# Patient Record
Sex: Male | Born: 1975
Health system: Southern US, Community
[De-identification: ages and names within clinical notes are randomized; demographics above are authoritative.]

## PROBLEM LIST (undated history)

## (undated) DIAGNOSIS — M21612 Bunion of left foot: Secondary | ICD-10-CM

## (undated) DIAGNOSIS — C801 Malignant (primary) neoplasm, unspecified: Secondary | ICD-10-CM

## (undated) DIAGNOSIS — M21611 Bunion of right foot: Secondary | ICD-10-CM

## (undated) DIAGNOSIS — S86899A Other injury of other muscle(s) and tendon(s) at lower leg level, unspecified leg, initial encounter: Secondary | ICD-10-CM

## (undated) HISTORY — PX: COLONOSCOPY: SHX174

## (undated) HISTORY — DX: Bunion of left foot: M21.612

## (undated) HISTORY — DX: Other injury of other muscle(s) and tendon(s) at lower leg level, unspecified leg, initial encounter: S86.899A

## (undated) HISTORY — PX: WISDOM TOOTH EXTRACTION: SHX21

## (undated) HISTORY — DX: Bunion of left foot: M21.611

## (undated) HISTORY — DX: Malignant (primary) neoplasm, unspecified: C80.1

---

## 2002-06-10 ENCOUNTER — Emergency Department (HOSPITAL_COMMUNITY): Admission: EM | Admit: 2002-06-10 | Discharge: 2002-06-10 | Payer: Self-pay | Admitting: Emergency Medicine

## 2011-03-11 ENCOUNTER — Encounter: Payer: Self-pay | Admitting: Gastroenterology

## 2011-03-11 DIAGNOSIS — J302 Other seasonal allergic rhinitis: Secondary | ICD-10-CM | POA: Insufficient documentation

## 2011-04-14 ENCOUNTER — Encounter: Payer: Self-pay | Admitting: Gastroenterology

## 2011-04-14 ENCOUNTER — Ambulatory Visit (INDEPENDENT_AMBULATORY_CARE_PROVIDER_SITE_OTHER): Payer: BC Managed Care – PPO | Admitting: Gastroenterology

## 2011-04-14 VITALS — BP 120/70 | HR 60 | Ht 73.0 in | Wt 224.2 lb

## 2011-04-14 DIAGNOSIS — Z800 Family history of malignant neoplasm of digestive organs: Secondary | ICD-10-CM | POA: Insufficient documentation

## 2011-04-14 MED ORDER — PEG-KCL-NACL-NASULF-NA ASC-C 100 G PO SOLR: 1.0000 | ORAL | Status: DC

## 2011-04-14 NOTE — Progress Notes (Signed)
   HPI: This is a  Very pleasant 35 yo man whose father was diagnosed with colon cancer in his early 38s. He was apparently cured with surgery alone.  He has no issues with his bowels.  No bleeding, no constipation.  Very regular   Review of systems: Pertinent positive and negative review of systems were noted in the above HPI section.  All other review of systems was otherwise negative.   Past Medical History  Diagnosis Date  . Shin splints     Past Surgical History  Procedure Date  . Mouth surgery     wisdom teeth     reports that he has never smoked. He has never used smokeless tobacco. He reports that he drinks alcohol. He reports that he does not use illicit drugs.  family history includes Cancer in his maternal grandfather; Colon cancer in his father; Hypertension in his paternal grandmother; Rheum arthritis in his mother; Skin cancer in his mother; Stroke in his paternal grandmother; and Uterine cancer in his maternal grandmother.    Current Medications, Allergies were all reviewed with the patient via [facility] electronic medical record system.    Physical Exam: BP 120/70  Pulse 60  Ht 6\' 1"  (1.854 m)  Wt 224 lb 3.2 oz (101.696 kg)  BMI 29.58 kg/m2 Constitutional: generally well-appearing Psychiatric: alert and oriented x3 Eyes: extraocular movements intact Mouth: oral pharynx moist, no lesions Neck: supple no lymphadenopathy Cardiovascular: heart regular rate and rhythm Lungs: clear to auscultation bilaterally Abdomen: soft, nontender, nondistended, no obvious ascites, no peritoneal signs, normal bowel sounds Extremities: no lower extremity edema bilaterally Skin: no lesions on visible extremities    Assessment and plan: 35 y.o. male with fam history fo colon cancer  Will schedule for colonoscopy at his soonest convenience.

## 2011-04-14 NOTE — Patient Instructions (Signed)
You will be set up for a colonoscopy.  

## 2011-05-07 ENCOUNTER — Encounter: Payer: Self-pay | Admitting: Gastroenterology

## 2011-05-07 ENCOUNTER — Ambulatory Visit (AMBULATORY_SURGERY_CENTER): Payer: BC Managed Care – PPO | Admitting: Gastroenterology

## 2011-05-07 VITALS — BP 114/69 | HR 65 | Temp 97.7°F | Resp 20 | Ht 73.0 in | Wt 224.0 lb

## 2011-05-07 DIAGNOSIS — Z1211 Encounter for screening for malignant neoplasm of colon: Secondary | ICD-10-CM

## 2011-05-07 DIAGNOSIS — Z800 Family history of malignant neoplasm of digestive organs: Secondary | ICD-10-CM

## 2011-05-07 MED ORDER — SODIUM CHLORIDE 0.9 % IV SOLN: 500.0000 mL | INTRAVENOUS | Status: DC

## 2011-05-07 NOTE — Patient Instructions (Signed)
Please refer to the blue and neon green sheets for instructions regarding diet and activity for the rest of today.  Normal. Resume previous medications.

## 2011-05-10 ENCOUNTER — Telehealth: Payer: Self-pay | Admitting: *Deleted

## 2011-05-10 NOTE — Telephone Encounter (Signed)
No ID on answering machine, no message left 

## 2013-11-07 ENCOUNTER — Encounter: Payer: Self-pay | Admitting: Family Medicine

## 2013-11-07 ENCOUNTER — Ambulatory Visit (INDEPENDENT_AMBULATORY_CARE_PROVIDER_SITE_OTHER): Payer: BC Managed Care – PPO | Admitting: Family Medicine

## 2013-11-07 VITALS — BP 130/84 | HR 61 | Ht 73.0 in | Wt 205.0 lb

## 2013-11-07 DIAGNOSIS — M999 Biomechanical lesion, unspecified: Secondary | ICD-10-CM | POA: Insufficient documentation

## 2013-11-07 DIAGNOSIS — M533 Sacrococcygeal disorders, not elsewhere classified: Secondary | ICD-10-CM | POA: Insufficient documentation

## 2013-11-07 NOTE — Assessment & Plan Note (Signed)
Decision today to treat with OMT was based on Physical Exam  After verbal consent patient was treated with HVLA ME techniques in thoracic, lumbar, and sacral areas  Patient tolerated the procedure well with improvement in symptoms  Patient given exercises, stretches and lifestyle modifications  See medications in patient instructions if given  Patient will follow up in 3 weeks        

## 2013-11-07 NOTE — Progress Notes (Signed)
[name] [address] [place], [place] [address] Phone: [phone] Subjective:     CC: Back pain   [id number] Nathaniel [name] is a 38 y.o. male coming in with complaint of back pain. Patient has been much more active recently and has lost 25 pounds with the course last year. Patient has been doing a boot camp a regular basis and states during exercise he does not have any pain but unfortunately during the day he starts having increasing tightness. Patient points closer to the right side of his back and states it's in the lumbar and sacral region. Patient denies any radiation down the leg or any numbness or weakness of the extremity. Patient states that it is very uncomfortable. Patient states that the last couple days he has stopped his workout routine disease care that is making it worse. Once again no significant pain no with exercise. Patient rated the pain as 6/10 in severity. Patient is concerned because if it starts affecting his activities daily living. Patient wants to remain active but he does not injure himself. Describes the pain as more of an ache that can have sharp pain. Does not respond well to over-the-counter medicines but does respond decent to some rest. No nighttime awakening. Denies any fevers or chills.      Past medical history, social, surgical and family history all reviewed in electronic medical record.   Review of Systems: No headache, visual changes, nausea, vomiting, diarrhea, constipation, dizziness, abdominal pain, skin rash, fevers, chills, night sweats, weight loss, swollen lymph nodes, body aches, joint swelling, muscle aches, chest pain, shortness of breath, mood changes.   Objective Blood pressure 130/84, pulse 61, height 6\' 1"  (1.854 m), weight 205 lb (92.987 kg), SpO2 97.00%.  General: No apparent distress alert and oriented x3 mood and affect normal, dressed appropriately.  HEENT: Pupils equal, extraocular movements intact    Respiratory: Patient's speak in full sentences and does not appear short of breath  Cardiovascular: No lower extremity edema, non tender, no erythema  Skin: Warm dry intact with no signs of infection or rash on extremities or on axial skeleton.  Abdomen: Soft nontender  Neuro: Cranial nerves II through XII are intact, neurovascularly intact in all extremities with 2+ DTRs and 2+ pulses.  Lymph: No lymphadenopathy of posterior or anterior cervical chain or axillae bilaterally.  Gait normal with good balance and coordination.  MSK:  Non tender with full range of motion and good stability and symmetric strength and tone of shoulders, elbows, wrist, hip, knee and ankles bilaterally.  Back Exam:  Inspection: Unremarkable  Motion: Flexion 45 deg, Extension 35 deg, Side Bending to 35 deg bilaterally,  Rotation to 35 deg bilaterally  SLR laying: Negative  XSLR laying: Negative  Palpable tenderness: Tender to palpation over the right SI joint. FABER: Positive for right Sensory change: Gross sensation intact to all lumbar and sacral dermatomes.  Reflexes: 2+ at both patellar tendons, 2+ at achilles tendons, Babinski's downgoing.  Strength at foot  Plantar-flexion: 5/5 Dorsi-flexion: 5/5 Eversion: 5/5 Inversion: 5/5  Leg strength  Quad: 5/5 Hamstring: 5/5 Hip flexor: 5/5 Hip abductors: 5/5  Gait unremarkable.  OMT Physical Exam  Standing flexion right  Seated Flexion right  Cervical Neutral   Thoracic T3 extended rotated and side bent left T7 extended rotated and side bent right  Lumbar L2 flexed rotated and side bent right  Sacrum Right on right  Illium  Neutral     Impression and Recommendations:  This case required medical decision making of moderate complexity.

## 2013-11-07 NOTE — Assessment & Plan Note (Signed)
Patient does have some sacroiliac joint dysfunction. Discuss different treatment options and patient wanted to stay with conservative therapy. I do agree with this. Patient given suggestions of over-the-counter medications and was given home exercise program. We discussed about proper stretching and proper technique with certain things. We discussed icing protocol. I also discussed patient with proper nutritional changes he can be beneficial. Patient did respond osteopathic manipulatoin.

## 2013-11-07 NOTE — Patient Instructions (Signed)
Very nice to meet you Exercises 3 times a week.  Do not stretch before activity but definitely aftward.  Sacroiliac Joint Mobilization and Rehab 1. Work on pretzel stretching, shoulder back and leg draped in front. 3-5 sets, 30 sec.. 2. hip abductor rotations. standing, hip flexion and rotation outward then inward. 3 sets, 15 reps. when can do comfortably, add ankle weights starting at 2 pounds.  3. cross over stretching - shoulder back to ground, same side leg crossover. 3-5 sets for 30 seconds..  4. rolling up and back knees to chest and rocking. 5. sacral tilt - 5 sets, hold for 5-10 seconds Hip flexor stretches then after biking or running.  When working try not to sit for great then 45 minutes.  Turmeric 500mg  twice daily Vitamin D 200 IU daily Whey protein isolate and 4:1 ratio of carbs to protein after exercise.  Example chocolate milk.  Come back in 2-3 weeks.

## 2013-11-28 ENCOUNTER — Ambulatory Visit (INDEPENDENT_AMBULATORY_CARE_PROVIDER_SITE_OTHER): Payer: BC Managed Care – PPO | Admitting: Family Medicine

## 2013-11-28 ENCOUNTER — Encounter: Payer: Self-pay | Admitting: Family Medicine

## 2013-11-28 VITALS — BP 130/76 | HR 64 | Ht 73.0 in | Wt 205.0 lb

## 2013-11-28 DIAGNOSIS — M533 Sacrococcygeal disorders, not elsewhere classified: Secondary | ICD-10-CM

## 2013-11-28 DIAGNOSIS — M999 Biomechanical lesion, unspecified: Secondary | ICD-10-CM

## 2013-11-28 NOTE — Assessment & Plan Note (Signed)
Decision today to treat with OMT was based on Physical Exam  After verbal consent patient was treated with HVLA ME techniques in thoracic, lumbar, and sacral areas  Patient tolerated the procedure well with improvement in symptoms  Patient given exercises, stretches and lifestyle modifications  See medications in patient instructions if given  Patient will follow up in 4-5 weeks

## 2013-11-28 NOTE — Assessment & Plan Note (Signed)
Patient is doing significantly better at this time. Patient was taught other exercises himself relation techniques that could be beneficial. We discussed over-the-counter medications again that could be beneficial. Patient will come back again in 4-6 weeks.  Spent greater than 25 minutes with patient face-to-face and had greater than 50% of counseling including as described above in assessment and plan.

## 2013-11-28 NOTE — Progress Notes (Signed)
  [name] [address] [place], [place] [address] Phone: [phone] Subjective:     CC: Back pain   [id number] Nathaniel [name] is a 38 y.o. male coming in for followup of sacroiliac joint dysfunction. Patient is doing considerably better he states. Patient is has a spasm from time to time that causes him some pain but his lungs he does the stretches it seems to go away almost completely. Patient denies any radiation down the legs or any numbness. Patient states that he is beginning to do all activities of daily living and is able to do his workout program and is not stopping prematurely. Patient does the manipulation has been helpful.     Past medical history, social, surgical and family history all reviewed in electronic medical record.   Review of Systems: No headache, visual changes, nausea, vomiting, diarrhea, constipation, dizziness, abdominal pain, skin rash, fevers, chills, night sweats, weight loss, swollen lymph nodes, body aches, joint swelling, muscle aches, chest pain, shortness of breath, mood changes.   Objective Blood pressure 130/76, pulse 64, height 6\' 1"  (1.854 m), SpO2 97.00%.  General: No apparent distress alert and oriented x3 mood and affect normal, dressed appropriately.  HEENT: Pupils equal, extraocular movements intact  Respiratory: Patient's speak in full sentences and does not appear short of breath  Cardiovascular: No lower extremity edema, non tender, no erythema  Skin: Warm dry intact with no signs of infection or rash on extremities or on axial skeleton.  Abdomen: Soft nontender  Neuro: Cranial nerves II through XII are intact, neurovascularly intact in all extremities with 2+ DTRs and 2+ pulses.  Lymph: No lymphadenopathy of posterior or anterior cervical chain or axillae bilaterally.  Gait normal with good balance and coordination.  MSK:  Non tender with full range of motion and good stability and symmetric strength and  tone of shoulders, elbows, wrist, hip, knee and ankles bilaterally.  Back Exam:  Inspection: Unremarkable  Motion: Flexion 45 deg, Extension 35 deg, Side Bending to 35 deg bilaterally,  Rotation to 35 deg bilaterally  SLR laying: Negative  XSLR laying: Negative  Palpable tenderness: Still mildly Tender to palpation over the right SI joint. FABER: Positive for right Sensory change: Gross sensation intact to all lumbar and sacral dermatomes.  Reflexes: 2+ at both patellar tendons, 2+ at achilles tendons, Babinski's downgoing.  Strength at foot  Plantar-flexion: 5/5 Dorsi-flexion: 5/5 Eversion: 5/5 Inversion: 5/5  Leg strength  Quad: 5/5 Hamstring: 5/5 Hip flexor: 5/5 Hip abductors: 5/5  Gait unremarkable.  OMT Physical Exam  Cervical C3 flexed rotated and side bent left  Thoracic T3 extended rotated and side bent left  Lumbar L2 flexed rotated and side bent right  Sacrum Right on right  Illium  Neutral     Impression and Recommendations:     This case required medical decision making of moderate complexity.

## 2013-11-28 NOTE — Patient Instructions (Signed)
Good to see you You are doing great Turmeric 500mg  twice daily.  Sugar packs looks for glucose, fructose and possibly sucrose.  Look at new exercises and look at 3 days a week.  Come back in 4-5 weeks.

## 2014-01-03 ENCOUNTER — Ambulatory Visit (INDEPENDENT_AMBULATORY_CARE_PROVIDER_SITE_OTHER): Payer: BC Managed Care – PPO | Admitting: Family Medicine

## 2014-01-03 ENCOUNTER — Encounter: Payer: Self-pay | Admitting: Family Medicine

## 2014-01-03 VITALS — BP 118/72 | HR 53 | Ht 73.0 in | Wt 201.0 lb

## 2014-01-03 DIAGNOSIS — M533 Sacrococcygeal disorders, not elsewhere classified: Secondary | ICD-10-CM

## 2014-01-03 DIAGNOSIS — M999 Biomechanical lesion, unspecified: Secondary | ICD-10-CM

## 2014-01-03 NOTE — Assessment & Plan Note (Signed)
Decision today to treat with OMT was based on Physical Exam  After verbal consent patient was treated with HVLA ME techniques in thoracic, lumbar, and sacral areas  Patient tolerated the procedure well with improvement in symptoms  Patient given exercises, stretches and lifestyle modifications  See medications in patient instructions if given  Patient will follow up in 5 weeks

## 2014-01-03 NOTE — Progress Notes (Signed)
  [name] [address] [place], [place] [address] Phone: [phone] Subjective:     CC: Back pain follow up  [id number] Nathaniel [name] is a 38 y.o. male coming in for followup of sacroiliac joint dysfunction. Patient states he has not had any spasm since last time. Overall he is feeling very good. Patient has remained active and is working out 4-5 times a week. Patient does the stretches intermittently. Patient is to do the more at work in between breaks. Denies any new symptoms and overall is feeling relatively well.    Past medical history, social, surgical and family history all reviewed in electronic medical record.   Review of Systems: No headache, visual changes, nausea, vomiting, diarrhea, constipation, dizziness, abdominal pain, skin rash, fevers, chills, night sweats, weight loss, swollen lymph nodes, body aches, joint swelling, muscle aches, chest pain, shortness of breath, mood changes.   Objective Blood pressure 118/72, pulse 53, height 6\' 1"  (1.854 m), weight 201 lb (91.173 kg), SpO2 96.00%.  General: No apparent distress alert and oriented x3 mood and affect normal, dressed appropriately.  HEENT: Pupils equal, extraocular movements intact  Respiratory: Patient's speak in full sentences and does not appear short of breath  Cardiovascular: No lower extremity edema, non tender, no erythema  Skin: Warm dry intact with no signs of infection or rash on extremities or on axial skeleton.  Abdomen: Soft nontender  Neuro: Cranial nerves II through XII are intact, neurovascularly intact in all extremities with 2+ DTRs and 2+ pulses.  Lymph: No lymphadenopathy of posterior or anterior cervical chain or axillae bilaterally.  Gait normal with good balance and coordination.  MSK:  Non tender with full range of motion and good stability and symmetric strength and tone of shoulders, elbows, wrist, hip, knee and ankles bilaterally.  Back Exam:  Inspection:  Unremarkable  Motion: Flexion 45 deg, Extension 35 deg, Side Bending to 40 deg bilaterally,  Rotation to 35 deg bilaterally  SLR laying: Negative  XSLR laying: Negative  Palpable tenderness: Still mildly Tender to palpation over the right SI joint continues to improve FABER: Negative Sensory change: Gross sensation intact to all lumbar and sacral dermatomes.  Reflexes: 2+ at both patellar tendons, 2+ at achilles tendons, Babinski's downgoing.  Strength at foot  Plantar-flexion: 5/5 Dorsi-flexion: 5/5 Eversion: 5/5 Inversion: 5/5  Leg strength  Quad: 5/5 Hamstring: 5/5 Hip flexor: 5/5 Hip abductors: 5/5  Gait unremarkable.  OMT Physical Exam  Cervical C3 flexed rotated and side bent left C5 flexed rotated inside that right  Thoracic T3 extended rotated and side bent left T5 extended rotated inside that right  Lumbar L2 flexed rotated and side bent right  Sacrum Right on right  Illium  Neutral     Impression and Recommendations:     This case required medical decision making of moderate complexity.

## 2014-01-03 NOTE — Patient Instructions (Addendum)
Good to see you as always.  Continue exercise 3 weeks.  You are doing great.  Great tattoo.  Come back in 5-6 weeks.  Turmeric 500mg  twice daily.

## 2014-01-03 NOTE — Assessment & Plan Note (Signed)
Patient is doing remarkably well at this time. Patient was given new exercises and face do strengthening exercises for the hip abductors and core that I think will be beneficial. Discussed icing as well as the over-the-counter medications which is not started yet. Patient will continue with the regimen that he is doing at this time. Patient will follow up though and 5-6 weeks for further evaluation and treatment.  Spent greater than 25 minutes with patient face-to-face and had greater than 50% of counseling including as described above in assessment and plan.

## 2014-01-07 ENCOUNTER — Encounter: Payer: Self-pay | Admitting: Family Medicine

## 2014-01-07 ENCOUNTER — Other Ambulatory Visit (INDEPENDENT_AMBULATORY_CARE_PROVIDER_SITE_OTHER): Payer: BC Managed Care – PPO

## 2014-01-07 ENCOUNTER — Ambulatory Visit (INDEPENDENT_AMBULATORY_CARE_PROVIDER_SITE_OTHER): Payer: BC Managed Care – PPO | Admitting: Family Medicine

## 2014-01-07 VITALS — BP 126/88 | HR 49 | Ht 73.0 in | Wt 202.0 lb

## 2014-01-07 DIAGNOSIS — M25511 Pain in right shoulder: Secondary | ICD-10-CM

## 2014-01-07 DIAGNOSIS — M25512 Pain in left shoulder: Secondary | ICD-10-CM

## 2014-01-07 DIAGNOSIS — M719 Bursopathy, unspecified: Secondary | ICD-10-CM

## 2014-01-07 DIAGNOSIS — M67919 Unspecified disorder of synovium and tendon, unspecified shoulder: Secondary | ICD-10-CM

## 2014-01-07 DIAGNOSIS — M75102 Unspecified rotator cuff tear or rupture of left shoulder, not specified as traumatic: Secondary | ICD-10-CM | POA: Insufficient documentation

## 2014-01-07 DIAGNOSIS — M25519 Pain in unspecified shoulder: Secondary | ICD-10-CM

## 2014-01-07 NOTE — Progress Notes (Signed)
[name] [address], [place] [address] Phone: [phone] Subjective:     CC: Left shoulder pain  [id number] Nathaniel [name] is a 38 y.o. male coming in with complaint of left shoulder pain. [place] wake [place]. Patient went and attempted to try to catch himself when he fell and felt some discomfort in the posterior aspect of the shoulder. I was 2 days ago and states that the pain seems to be worsening. Patient states that whenever he tries to do overhead activity as severe pain. Denies any numbness or tingling or any radiation down the arm. Denies any neck pain that is associated with a. Describes the pain as a dull aching sensation with a sharp 10 out of 10 pain with certain movements. Patient has never injured this shoulder previously.     Past medical history, social, surgical and family history all reviewed in electronic medical record.   Review of Systems: No headache, visual changes, nausea, vomiting, diarrhea, constipation, dizziness, abdominal pain, skin rash, fevers, chills, night sweats, weight loss, swollen lymph nodes, body aches, joint swelling, muscle aches, chest pain, shortness of breath, mood changes.   Objective Blood pressure 126/88, pulse 49, height 6\' 1"  (1.854 m), weight 202 lb (91.627 kg), SpO2 97.00%.  General: No apparent distress alert and oriented x3 mood and affect normal, dressed appropriately.  HEENT: Pupils equal, extraocular movements intact  Respiratory: Patient's speak in full sentences and does not appear short of breath  Cardiovascular: No lower extremity edema, non tender, no erythema  Skin: Warm dry intact with no signs of infection or rash on extremities or on axial skeleton.  Abdomen: Soft nontender  Neuro: Cranial nerves II through XII are intact, neurovascularly intact in all extremities with 2+ DTRs and 2+ pulses.  Lymph: No lymphadenopathy of  posterior or anterior cervical chain or axillae bilaterally.  Gait normal with good balance and coordination.  MSK:  Non tender with full range of motion and good stability and symmetric strength and tone of shoulders, elbows, wrist, hip, knee and ankles bilaterally.  Shoulder: left Inspection reveals no abnormalities, atrophy or asymmetry. Palpation is normal with no tenderness over AC joint or bicipital groove. ROM is full in all planes passively. Can tell patient does have pain with full flexion Rotator cuff strength normal throughout. signs of impingement with positive Neer and Hawkin's tests, but negative empty can sign. Speeds and Yergason's tests normal.  labral pathology noted positive Obrien's, negative clunk and good stability. Normal scapular function observed. No painful arc and no drop arm sign. No apprehension sign  MSK US performed of: left This study was ordered, performed, and interpreted by Charlann Boxer D.O.  Shoulder:   Supraspinatus:  Appears normal on long and transverse views, Bursal bulge seen with shoulder abduction on impingement view. Infraspinatus:  Appears normal on long and transverse views. Significant increase in Doppler flow Subscapularis:  Appears normal on long and transverse views. Positive bursa as well as increasing output flow Teres Minor:  Appears normal on long and transverse views. AC joint:  Capsule undistended, no geyser sign. Glenohumeral Joint:  Appears normal without effusion. Glenoid Labrum:  Intact without visualized tears. Patient though does have significant increase in Doppler flow. Questionable linear line noted. Biceps Tendon:  Appears normal on long and transverse views, no fraying of tendon, tendon located in intertubercular groove, no subluxation with shoulder internal or external rotation.  Impression:  Rotator cuff contusion with questionable labral tear  Procedure: Real-time Ultrasound Guided Injection of left glenohumeral  joint Device: GE Logiq E  Ultrasound guided injection is preferred based studies that show increased duration, increased effect, greater accuracy, decreased procedural pain, increased response rate with ultrasound guided versus blind injection.  Verbal informed consent obtained.  Time-out conducted.  Noted no overlying erythema, induration, or other signs of local infection.  Skin prepped in a sterile fashion.  Local anesthesia: Topical Ethyl chloride.  With sterile technique and under real time ultrasound guidance:  Joint visualized.  23g 1  inch needle inserted posterior approach. Pictures taken for needle placement. Patient did have injection of 2 cc of 1% lidocaine, 2 cc of 0.5% Marcaine, and 1.0 cc of Kenalog 40 mg/dL. Completed without difficulty  Pain immediately resolved suggesting accurate placement of the medication.  Advised to call if fevers/chills, erythema, induration, drainage, or persistent bleeding.  Images permanently stored and available for review in the ultrasound unit.  Impression: Technically successful ultrasound guided injection.    Impression and Recommendations:     This case required medical decision making of moderate complexity.

## 2014-01-07 NOTE — Assessment & Plan Note (Signed)
Patient had a injection as described above with some mild improvement in pain. I think there is a potential concern the patient actually has a labral tear if he does not make any significant improvement. I like to get x-rays to rule out any type of fracture but I think this is very unlikely. Patient likely has a bruise and it is going to continue to give him some trouble for another 4-5 days. Patient is not make any significant improvement he will call me in about a week's time and we will order potential further imaging. Patient will followup: 2 weeks after doing home exercises, anti-inflammatories, icing protocol. She'll patient proper technique in how to use theraband Once again see patient in 2 weeks Spent greater than 25 minutes with patient face-to-face and had greater than 50% of counseling including as described above in assessment and plan.

## 2014-01-07 NOTE — Patient Instructions (Signed)
Good to see you Ice 20 minutes 2 times daily.  Advil 3 times daily for 3 days.  Exercises most days of the week.  No push ups no pullups, burpees or anything fun.  Can run, bike would be great. Dx.  Rotator cuff contusion with ? Labral tear.  Come back in 2 weeks.

## 2014-01-23 ENCOUNTER — Encounter: Payer: Self-pay | Admitting: Family Medicine

## 2014-01-23 ENCOUNTER — Ambulatory Visit (INDEPENDENT_AMBULATORY_CARE_PROVIDER_SITE_OTHER): Payer: BC Managed Care – PPO | Admitting: Family Medicine

## 2014-01-23 VITALS — BP 128/76 | HR 64 | Ht 73.0 in | Wt 194.0 lb

## 2014-01-23 DIAGNOSIS — M719 Bursopathy, unspecified: Secondary | ICD-10-CM

## 2014-01-23 DIAGNOSIS — M76811 Anterior tibial syndrome, right leg: Secondary | ICD-10-CM

## 2014-01-23 DIAGNOSIS — M76819 Anterior tibial syndrome, unspecified leg: Secondary | ICD-10-CM | POA: Insufficient documentation

## 2014-01-23 DIAGNOSIS — M75102 Unspecified rotator cuff tear or rupture of left shoulder, not specified as traumatic: Secondary | ICD-10-CM

## 2014-01-23 DIAGNOSIS — M76829 Posterior tibial tendinitis, unspecified leg: Secondary | ICD-10-CM

## 2014-01-23 DIAGNOSIS — M67919 Unspecified disorder of synovium and tendon, unspecified shoulder: Secondary | ICD-10-CM

## 2014-01-23 NOTE — Assessment & Plan Note (Signed)
The patient was given home exercise program, icing protocol, heel lift in his toes which I think will be beneficial to decrease the amount of dorsiflexion actively. We discussed compression sleeve and was given a trial of topical anti-inflammatory. Patient will come back again and see me again in 3 weeks.  Spent greater than 25 minutes with patient face-to-face and had greater than 50% of counseling including as described above in assessment and plan.

## 2014-01-23 NOTE — Patient Instructions (Addendum)
Good to see that shoulder is better.  For your anterior tibialis tendonitis.  For the nutrition overall doing well but would add more protein.  Whey protein isolate is my favorite. Protein you should have 1 gram for every pound.  Wear the heel lift in toes.  Ice 20 minutes 2 times a day.  [email or web address] Calf sleeve size medium.  Compression sleeve with running.  Running on flat surfaces would be better.  Pennsaid twice daily for next 5 days.  Come back in 3 weeks if not better or I will see you for manipulation soon.

## 2014-01-23 NOTE — Progress Notes (Signed)
  [name] [address] [place], [place] [address] Phone: [phone] Subjective:     CC: Left shoulder pain follow up.   [id number] Teren Zurcher is a 38 y.o. male coming in with complaint of left shoulder pain. Patient was seen previously and had what appeared to be a rotator cuff contusion with a questionable labral tear on ultrasound. Patient was given an injection, home exercises, icing protocol. Patient states since then the pain is almost completely resolved. Overall he has been doing very well and has started increasing activity slowly. Patient states that he is able to do most activities without any significant discomfort whatsoever. Patient is happy with the results.   Patient with a new problem  Patient also is having right-sided anterior shin pain. States it is more on the outside. Hurts more when he dorsiflexes his foot. States that he was running more because he is trying to do other activities and had been running more. Patient states that there is a creaking sensation. It is more of a dull aching pain. Patient has been able to ambulate but states that is somewhat discomfort but not true pain. States he's had a history of stress fractures previously but this is on the other side.       Past medical history, social, surgical and family history all reviewed in electronic medical record.   Review of Systems: No headache, visual changes, nausea, vomiting, diarrhea, constipation, dizziness, abdominal pain, skin rash, fevers, chills, night sweats, weight loss, swollen lymph nodes, body aches, joint swelling, muscle aches, chest pain, shortness of breath, mood changes.   Objective Blood pressure 128/76, pulse 64, height 6\' 1"  (1.854 m), weight 194 lb (87.998 kg), SpO2 97.00%.  General: No apparent distress alert and oriented x3 mood and affect normal, dressed appropriately.  HEENT: Pupils equal, extraocular movements intact  Respiratory: Patient's  speak in full sentences and does not appear short of breath  Cardiovascular: No lower extremity edema, non tender, no erythema  Skin: Warm dry intact with no signs of infection or rash on extremities or on axial skeleton.  Abdomen: Soft nontender  Neuro: Cranial nerves II through XII are intact, neurovascularly intact in all extremities with 2+ DTRs and 2+ pulses.  Lymph: No lymphadenopathy of posterior or anterior cervical chain or axillae bilaterally.  Gait normal with good balance and coordination.  MSK:  Non tender with full range of motion and good stability and symmetric strength and tone of shoulders, elbows, wrist, hip, knee and ankles bilaterally.  Shoulder: left Inspection reveals no abnormalities, atrophy or asymmetry. Palpation is normal with no tenderness over AC joint or bicipital groove. ROM is full in all planes passively.  Rotator cuff strength normal throughout.  no signs of impingement  Speeds and Yergason's tests normal.  labral pathology noted positive Obrien's, negative clunk and good stability. Normal scapular function observed. No painful arc and no drop arm sign. No apprehension sign Right shin does have some mild tenderness over the anterior tibialis tendon. There is some synovitis noted. Neurovascularly intact distally. Full strength of the ankle and no swelling appreciated with no redness of the skin. Contralateral shin is unremarkable.   Impression and Recommendations:     This case required medical decision making of moderate complexity.

## 2014-01-23 NOTE — Assessment & Plan Note (Signed)
Shoulder is doing much more improvement. Patient is going to continue home exercises as well as the icing regimen. Patient is lung is continuing to get better can progress his activity. Patient in followup on an as-needed basis for this.

## 2014-02-14 ENCOUNTER — Ambulatory Visit: Payer: BC Managed Care – PPO | Admitting: Family Medicine

## 2014-10-24 ENCOUNTER — Ambulatory Visit: Payer: Self-pay | Admitting: Family Medicine

## 2014-10-24 ENCOUNTER — Encounter: Payer: Self-pay | Admitting: Family Medicine

## 2014-10-24 ENCOUNTER — Ambulatory Visit (INDEPENDENT_AMBULATORY_CARE_PROVIDER_SITE_OTHER): Payer: BLUE CROSS/BLUE SHIELD | Admitting: Family Medicine

## 2014-10-24 VITALS — BP 124/70 | HR 63 | Ht 73.0 in | Wt 200.0 lb

## 2014-10-24 DIAGNOSIS — M9904 Segmental and somatic dysfunction of sacral region: Secondary | ICD-10-CM | POA: Diagnosis not present

## 2014-10-24 DIAGNOSIS — M9903 Segmental and somatic dysfunction of lumbar region: Secondary | ICD-10-CM

## 2014-10-24 DIAGNOSIS — M9902 Segmental and somatic dysfunction of thoracic region: Secondary | ICD-10-CM | POA: Diagnosis not present

## 2014-10-24 DIAGNOSIS — M999 Biomechanical lesion, unspecified: Secondary | ICD-10-CM

## 2014-10-24 DIAGNOSIS — M533 Sacrococcygeal disorders, not elsewhere classified: Secondary | ICD-10-CM | POA: Diagnosis not present

## 2014-10-24 NOTE — Assessment & Plan Note (Signed)
Decision today to treat with OMT was based on Physical Exam  After verbal consent patient was treated with HVLA ME techniques in thoracic, lumbar, and sacral areas  Patient tolerated the procedure well with improvement in symptoms  Patient given exercises, stretches and lifestyle modifications  See medications in patient instructions if given  Patient will follow up in 3 weeks

## 2014-10-24 NOTE — Assessment & Plan Note (Signed)
Exacerbation and injury.  HEP given and discussed HEP again.  Discussed icing and given a trial size of anti-inflammatories to take for the next 6 days. Patient will continue with the natural supplementations. Patient will continue to work with the core strengthening. Patient come back and see me again in 3 weeks to make sure that patient is responding well.

## 2014-10-24 NOTE — Progress Notes (Signed)
  [name] [address] [place], [place] [address] Phone: [phone] Subjective:     CC: Back pain follow up  [id number]:[id number] Nathaniel [name] is a 39 y.o. male coming in for followup of sacroiliac joint dysfunction. Patient had been doing very good but unfortunately has recently injured a muscle in his back. Patient has been seen previously for sacroiliac joint dysfunction. Patient was mountain biking hit a root of a tree and fell back pain immediately. Denies any radiation down the legs any numbness or tingling. This happened 4 days ago. Since then just seems that his back is getting tighter and tighter. Denies any numbness or tingling denies any weakness of the lower extremity. Denies any bowel or bladder incontinence. Seems to be localized over the sacral area.    Past medical history, social, surgical and family history all reviewed in electronic medical record.   Review of Systems: No headache, visual changes, nausea, vomiting, diarrhea, constipation, dizziness, abdominal pain, skin rash, fevers, chills, night sweats, weight loss, swollen lymph nodes, body aches, joint swelling, muscle aches, chest pain, shortness of breath, mood changes.   Objective There were no vitals taken for this visit.  General: No apparent distress alert and oriented x3 mood and affect normal, dressed appropriately.  HEENT: Pupils equal, extraocular movements intact  Respiratory: Patient's speak in full sentences and does not appear short of breath  Cardiovascular: No lower extremity edema, non tender, no erythema  Skin: Warm dry intact with no signs of infection or rash on extremities or on axial skeleton.  Abdomen: Soft nontender  Neuro: Cranial nerves II through XII are intact, neurovascularly intact in all extremities with 2+ DTRs and 2+ pulses.  Lymph: No lymphadenopathy of posterior or anterior cervical chain or axillae bilaterally.  Gait normal with good balance and  coordination.  MSK:  Non tender with full range of motion and good stability and symmetric strength and tone of shoulders, elbows, wrist, hip, knee and ankles bilaterally.  Back Exam:  Inspection: Unremarkable  Motion: Flexion 45 deg, Extension 35 deg, Side Bending to 40 deg bilaterally,  Rotation to 35 deg bilaterally  SLR laying: Negative  XSLR laying: Negative  Palpable tenderness: Still mildly Tender to palpation over the right SI joint continues to improve FABER: Negative Sensory change: Gross sensation intact to all lumbar and sacral dermatomes.  Reflexes: 2+ at both patellar tendons, 2+ at achilles tendons, Babinski's downgoing.  Strength at foot  Plantar-flexion: 5/5 Dorsi-flexion: 5/5 Eversion: 5/5 Inversion: 5/5  Leg strength  Quad: 5/5 Hamstring: 5/5 Hip flexor: 5/5 Hip abductors: 5/5  Gait unremarkable.  OMT Physical Exam  Cervical C3 flexed rotated and side bent left C5 flexed rotated inside that right  Thoracic T3 extended rotated and side bent left T5 extended rotated inside that right  Lumbar L2 flexed rotated and side bent right  Sacrum Right on right  Illium  Neutral     Impression and Recommendations:     This case required medical decision making of moderate complexity.

## 2014-10-24 NOTE — Patient Instructions (Addendum)
Good to see you Ice 20 minutes 2 times daily. Usually after activity and before bed. Try exercises we have discussed Consider the medicine 2 times daily for 6 days Start exercises again on Monday See me again in 3 weeks.

## 2014-10-24 NOTE — Progress Notes (Signed)
Pre visit review using our clinic review tool, if applicable. No additional management support is needed unless otherwise documented below in the visit note. 

## 2014-11-14 ENCOUNTER — Encounter: Payer: Self-pay | Admitting: Family Medicine

## 2014-11-14 ENCOUNTER — Ambulatory Visit (INDEPENDENT_AMBULATORY_CARE_PROVIDER_SITE_OTHER): Payer: BLUE CROSS/BLUE SHIELD | Admitting: Family Medicine

## 2014-11-14 VITALS — BP 126/78 | HR 55 | Ht 73.0 in | Wt 197.0 lb

## 2014-11-14 DIAGNOSIS — M9904 Segmental and somatic dysfunction of sacral region: Secondary | ICD-10-CM | POA: Diagnosis not present

## 2014-11-14 DIAGNOSIS — M999 Biomechanical lesion, unspecified: Secondary | ICD-10-CM

## 2014-11-14 DIAGNOSIS — M9902 Segmental and somatic dysfunction of thoracic region: Secondary | ICD-10-CM | POA: Diagnosis not present

## 2014-11-14 DIAGNOSIS — M533 Sacrococcygeal disorders, not elsewhere classified: Secondary | ICD-10-CM | POA: Diagnosis not present

## 2014-11-14 DIAGNOSIS — M9903 Segmental and somatic dysfunction of lumbar region: Secondary | ICD-10-CM | POA: Diagnosis not present

## 2014-11-14 NOTE — Assessment & Plan Note (Signed)
Decision today to treat with OMT was based on Physical Exam  After verbal consent patient was treated with HVLA ME techniques in thoracic, lumbar, and sacral areas  Patient tolerated the procedure well with improvement in symptoms  Patient given exercises, stretches and lifestyle modifications  See medications in patient instructions if given  Patient will follow up in 6-8 weeks

## 2014-11-14 NOTE — Progress Notes (Signed)
Pre visit review using our clinic review tool, if applicable. No additional management support is needed unless otherwise documented below in the visit note. 

## 2014-11-14 NOTE — Patient Instructions (Signed)
You are the man! You are doing great!!! The upper back a little tight Ice when you need it.  Keep it going [employer] or [employer].  See me when you need me.

## 2014-11-14 NOTE — Progress Notes (Signed)
  [name] [address] [place], [place] [address] Phone: [phone] Subjective:     CC: Back pain follow up  [id number] Nathaniel [name] is a 39 y.o. male coming in for followup of sacroiliac joint dysfunction. Patient was seen previously and he did have a flare with recurrent sacroiliac joint dysfunction. Patient had responded well to osteopathic manipulation in the past. Patient started doing the exercises on a more regular basis. Patient was given a short course of anti-inflammatories. Patient states he is doing much better at this time. Describes the pain as more mild. States that he is doing much better overall. Patient to do all activities of daily living as well as working out on a regular basis.    Past medical history, social, surgical and family history all reviewed in electronic medical record.   Review of Systems: No headache, visual changes, nausea, vomiting, diarrhea, constipation, dizziness, abdominal pain, skin rash, fevers, chills, night sweats, weight loss, swollen lymph nodes, body aches, joint swelling, muscle aches, chest pain, shortness of breath, mood changes.   Objective Blood pressure 126/78, pulse 55, height 6\' 1"  (1.854 m), weight 197 lb (89.359 kg), SpO2 97 %.  General: No apparent distress alert and oriented x3 mood and affect normal, dressed appropriately.  HEENT: Pupils equal, extraocular movements intact  Respiratory: Patient's speak in full sentences and does not appear short of breath  Cardiovascular: No lower extremity edema, non tender, no erythema  Skin: Warm dry intact with no signs of infection or rash on extremities or on axial skeleton.  Abdomen: Soft nontender  Neuro: Cranial nerves II through XII are intact, neurovascularly intact in all extremities with 2+ DTRs and 2+ pulses.  Lymph: No lymphadenopathy of posterior or anterior cervical chain or axillae bilaterally.  Gait normal with good balance and coordination.    MSK:  Non tender with full range of motion and good stability and symmetric strength and tone of shoulders, elbows, wrist, hip, knee and ankles bilaterally.  Back Exam:  Inspection: Unremarkable  Motion: Flexion 45 deg, Extension 35 deg, Side Bending to 40 deg bilaterally,  Rotation to 35 deg bilaterally  SLR laying: Negative  XSLR laying: Negative  Palpable tenderness: Nontender today FABER: Negative Sensory change: Gross sensation intact to all lumbar and sacral dermatomes.  Reflexes: 2+ at both patellar tendons, 2+ at achilles tendons, Babinski's downgoing.  Strength at foot  Plantar-flexion: 5/5 Dorsi-flexion: 5/5 Eversion: 5/5 Inversion: 5/5  Leg strength  Quad: 5/5 Hamstring: 5/5 Hip flexor: 5/5 Hip abductors: 5/5  Gait unremarkable.  OMT Physical Exam  Cervical Neutral  Thoracic T3 extended rotated and side bent left T5 extended rotated inside that right  Lumbar L2 flexed rotated and side bent right  Sacrum Right on right  Illium  Neutral    Impression and Recommendations:     This case required medical decision making of moderate complexity.

## 2014-11-14 NOTE — Assessment & Plan Note (Signed)
Patient does have more of a dull aching pain. Patient though is doing much better. Encourage him to continue the exercises and the hip abductor strengthening exercises. Patient is taking active. Denies any numbness or tingling. Denies any radiation of pain.

## 2016-04-15 ENCOUNTER — Encounter: Payer: Self-pay | Admitting: Gastroenterology

## 2016-04-29 ENCOUNTER — Encounter: Payer: Self-pay | Admitting: Gastroenterology

## 2016-07-06 ENCOUNTER — Ambulatory Visit (AMBULATORY_SURGERY_CENTER): Payer: BLUE CROSS/BLUE SHIELD | Admitting: *Deleted

## 2016-07-06 VITALS — Ht 73.0 in | Wt 202.8 lb

## 2016-07-06 DIAGNOSIS — Z800 Family history of malignant neoplasm of digestive organs: Secondary | ICD-10-CM

## 2016-07-06 MED ORDER — NA SULFATE-K SULFATE-MG SULF 17.5-3.13-1.6 GM/177ML PO SOLN: ORAL | 0 refills | Status: DC

## 2016-07-06 NOTE — Progress Notes (Signed)
No egg or soy allergy  No anesthesia problems per pt; has never been intubated but denies trouble moving neck  No diet medications taken  No home oxygen used or hx of sleep apnea  $15 off Suprep coupon given

## 2016-07-12 ENCOUNTER — Encounter: Payer: Self-pay | Admitting: Gastroenterology

## 2016-07-19 ENCOUNTER — Ambulatory Visit (AMBULATORY_SURGERY_CENTER): Payer: BLUE CROSS/BLUE SHIELD | Admitting: Gastroenterology

## 2016-07-19 ENCOUNTER — Encounter: Payer: Self-pay | Admitting: Gastroenterology

## 2016-07-19 VITALS — BP 111/64 | HR 53 | Temp 97.7°F | Resp 18 | Ht 73.0 in | Wt 202.0 lb

## 2016-07-19 DIAGNOSIS — Z1212 Encounter for screening for malignant neoplasm of rectum: Secondary | ICD-10-CM

## 2016-07-19 DIAGNOSIS — Z800 Family history of malignant neoplasm of digestive organs: Secondary | ICD-10-CM | POA: Diagnosis present

## 2016-07-19 DIAGNOSIS — Z1211 Encounter for screening for malignant neoplasm of colon: Secondary | ICD-10-CM

## 2016-07-19 MED ORDER — SODIUM CHLORIDE 0.9 % IV SOLN: 500.0000 mL | INTRAVENOUS | Status: AC

## 2016-07-19 NOTE — Op Note (Signed)
[facility] Patient Name: Nathaniel [name] Procedure Date: 07/19/2016 7:49 AM MRN: [id number] Endoscopist: Milus Banister , MD Age: 41 Referring MD:  Date of Birth: [birth year] Gender: Male Account #: [id number] Procedure:                Colonoscopy Indications:              Screening in patient at increased risk: Family                            history of 1st-degree relative with colorectal                            cancer before age 75 years (father diagnosed in his                            31s); colonoscopy 05/2011 was normal Medicines:                Monitored Anesthesia Care Procedure:                Pre-Anesthesia Assessment:                           - Prior to the procedure, a History and Physical                            was performed, and patient medications and                            allergies were reviewed. The patient's tolerance of                            previous anesthesia was also reviewed. The risks                            and benefits of the procedure and the sedation                            options and risks were discussed with the patient.                            All questions were answered, and informed consent                            was obtained. Prior Anticoagulants: The patient has                            taken no previous anticoagulant or antiplatelet                            agents. ASA Grade Assessment: I - A normal, healthy                            patient. After reviewing the risks and benefits,  the patient was deemed in satisfactory condition to                            undergo the procedure.                           After obtaining informed consent, the colonoscope                            was passed under direct vision. Throughout the                            procedure, the patient's blood pressure, pulse, and                            oxygen saturations were monitored  continuously. The                            Colonoscope was introduced through the anus and                            advanced to the the cecum, identified by                            appendiceal orifice and ileocecal valve. The                            colonoscopy was performed without difficulty. The                            patient tolerated the procedure well. The quality                            of the bowel preparation was excellent. The                            ileocecal valve, appendiceal orifice, and rectum                            were photographed. Scope In: 7:55:05 AM Scope Out: 8:05:20 AM Scope Withdrawal Time: 0 hours 8 minutes 26 seconds  Total Procedure Duration: 0 hours 10 minutes 15 seconds  Findings:                 The entire examined colon appeared normal on direct                            and retroflexion views.                           No polyps or cancers. Complications:            No immediate complications. Estimated blood loss:                            None. Estimated Blood Loss:  Estimated blood loss: none. Impression:               - The entire examined colon is normal on direct and                            retroflexion views.                           - No polyps or cancers. Recommendation:           - Patient has a contact number available for                            emergencies. The signs and symptoms of potential                            delayed complications were discussed with the                            patient. Return to normal activities tomorrow.                            Written discharge instructions were provided to the                            patient.                           - Resume previous diet.                           - Continue present medications.                           - Repeat colonoscopy in 5 years for screening                            purposes. Milus Banister, MD 07/19/2016 8:08:19  AM This report has been signed electronically.

## 2016-07-19 NOTE — Progress Notes (Signed)
Report given to PACU RN, vss 

## 2016-07-19 NOTE — Patient Instructions (Signed)
YOU HAD AN ENDOSCOPIC PROCEDURE TODAY AT THE [facility]:   Refer to the procedure report that was given to you for any specific questions about what was found during the examination.  If the procedure report does not answer your questions, please call your gastroenterologist to clarify.  If you requested that your care partner not be given the details of your procedure findings, then the procedure report has been included in a sealed envelope for you to review at your convenience later.  YOU SHOULD EXPECT: Some feelings of bloating in the abdomen. Passage of more gas than usual.  Walking can help get rid of the air that was put into your GI tract during the procedure and reduce the bloating. If you had a lower endoscopy (such as a colonoscopy or flexible sigmoidoscopy) you may notice spotting of blood in your stool or on the toilet paper. If you underwent a bowel prep for your procedure, you may not have a normal bowel movement for a few days.  Please Note:  You might notice some irritation and congestion in your nose or some drainage.  This is from the oxygen used during your procedure.  There is no need for concern and it should clear up in a day or so.  SYMPTOMS TO REPORT IMMEDIATELY:   Following lower endoscopy (colonoscopy or flexible sigmoidoscopy):  Excessive amounts of blood in the stool  Significant tenderness or worsening of abdominal pains  Swelling of the abdomen that is new, acute  Fever of 100F or higher   For urgent or emergent issues, a gastroenterologist can be reached at any hour by calling [phone].   DIET:  We do recommend a small meal at first, but then you may proceed to your regular diet.  Drink plenty of fluids but you should avoid alcoholic beverages for 24 hours.  ACTIVITY:  You should plan to take it easy for the rest of today and you should NOT DRIVE or use heavy machinery until tomorrow (because of the sedation medicines used during the test).     FOLLOW UP: Our staff will call the number listed on your records the next business day following your procedure to check on you and address any questions or concerns that you may have regarding the information given to you following your procedure. If we do not reach you, we will leave a message.  However, if you are feeling well and you are not experiencing any problems, there is no need to return our call.  We will assume that you have returned to your regular daily activities without incident.  If any biopsies were taken you will be contacted by phone or by letter within the next 1-3 weeks.  Please call us at [phone] if you have not heard about the biopsies in 3 weeks.    SIGNATURES/CONFIDENTIALITY: You and/or your care partner have signed paperwork which will be entered into your electronic medical record.  These signatures attest to the fact that that the information above on your After Visit Summary has been reviewed and is understood.  Full responsibility of the confidentiality of this discharge information lies with you and/or your care-partner.  Thank-you for choosing us for your healthcare needs today. 

## 2016-07-20 ENCOUNTER — Telehealth: Payer: Self-pay

## 2016-07-20 ENCOUNTER — Telehealth: Payer: Self-pay | Admitting: *Deleted

## 2016-07-20 NOTE — Telephone Encounter (Signed)
  Follow up Call-  Call back number 07/19/2016  Post procedure Call Back phone  # [phone]  Permission to leave phone message Yes  Some recent data might be hidden     Patient questions:  Left message to call us if necessary.

## 2016-07-20 NOTE — Telephone Encounter (Signed)
  Follow up Call-  Call back number 07/19/2016  Post procedure Call Back phone  # [phone]  Permission to leave phone message Yes  Some recent data might be hidden     Patient questions:  Do you have a fever, pain , or abdominal swelling? No. Pain Score  0 *  Have you tolerated food without any problems? Yes.    Have you been able to return to your normal activities? Yes.    Do you have any questions about your discharge instructions: Diet   No. Medications  No. Follow up visit  No.  Do you have questions or concerns about your Care? No.  Actions: * If pain score is 4 or above: No action needed, pain <4.

## 2018-02-14 ENCOUNTER — Ambulatory Visit: Payer: BLUE CROSS/BLUE SHIELD | Admitting: Podiatry

## 2018-08-25 ENCOUNTER — Ambulatory Visit: Payer: BLUE CROSS/BLUE SHIELD | Admitting: Family Medicine

## 2019-08-02 ENCOUNTER — Encounter: Payer: Self-pay | Admitting: Family Medicine

## 2019-08-02 ENCOUNTER — Ambulatory Visit (INDEPENDENT_AMBULATORY_CARE_PROVIDER_SITE_OTHER): Payer: 59

## 2019-08-02 ENCOUNTER — Ambulatory Visit (INDEPENDENT_AMBULATORY_CARE_PROVIDER_SITE_OTHER): Payer: 59 | Admitting: Family Medicine

## 2019-08-02 ENCOUNTER — Other Ambulatory Visit: Payer: Self-pay

## 2019-08-02 VITALS — BP 110/80 | HR 64 | Ht 73.0 in | Wt 219.0 lb

## 2019-08-02 DIAGNOSIS — M25561 Pain in right knee: Secondary | ICD-10-CM

## 2019-08-02 DIAGNOSIS — M76891 Other specified enthesopathies of right lower limb, excluding foot: Secondary | ICD-10-CM | POA: Diagnosis not present

## 2019-08-02 MED ORDER — VITAMIN D (ERGOCALCIFEROL) 1.25 MG (50000 UNIT) PO CAPS: 50000.0000 [IU] | ORAL_CAPSULE | ORAL | 0 refills | Status: AC

## 2019-08-02 NOTE — Progress Notes (Signed)
Nathaniel [name] [facility] [place] Phone: [phone] Subjective:   Nathaniel [name], am serving as a scribe for Dr. Hulan Saas. This visit occurred during the SARS-CoV-2 public health emergency.  Safety protocols were in place, including screening questions prior to the visit, additional usage of staff PPE, and extensive cleaning of exam room while observing appropriate contact time as indicated for disinfecting solutions.   I'm seeing this patient by the request  of:  Marton Redwood, MD  CC: Hip pain, bilateral and right knee pain  [id number]  Nathaniel [name] is a 44 y.o. male coming in with complaint of bilateral hip pain. Last seen in 2016. Pain increases with prolonged sitting over the greater trochanter. Movement helps his pain.   Also having right knee pain. Patient is doing 4 boot camps a week. Ran on the track on Sunday. Patient has pain over lateral aspect of knee. Feels weak as if he doesn't have any power. Denies any radiating symptoms.  Patient states that this seems to be the worst portion.  States that this is what is keeping him from working out on a regular basis      Past Medical History:  Diagnosis Date  . Bilateral bunions   . Shin splints    Past Surgical History:  Procedure Laterality Date  . COLONOSCOPY    . WISDOM TOOTH EXTRACTION     Social History   Socioeconomic History  . Marital status: Single    Spouse name: Not on file  . Number of children: 1  . Years of education: Not on file  . Highest education level: Not on file  Occupational History    Employer: [employer]  Tobacco Use  . Smoking status: Never Smoker  . Smokeless tobacco: Never Used  Substance and Sexual Activity  . Alcohol use: Yes    Comment: occasionally  . Drug use: [name]  . Sexual activity: Not on file  Other Topics Concern  . Not on file  Social History Narrative  . Not on file   Social Determinants of Health    Financial Resource Strain:   . Difficulty of Paying Living Expenses: Not on file  Food Insecurity:   . Worried About Charity fundraiser in the Last Year: Not on file  . Ran Out of Food in the Last Year: Not on file  Transportation Needs:   . Lack of Transportation (Medical): Not on file  . Lack of Transportation (Non-Medical): Not on file  Physical Activity:   . Days of Exercise per Week: Not on file  . Minutes of Exercise per Session: Not on file  Stress:   . Feeling of Stress : Not on file  Social Connections:   . Frequency of Communication with Friends and Family: Not on file  . Frequency of Social Gatherings with Friends and Family: Not on file  . Attends Religious Services: Not on file  . Active Member of Clubs or Organizations: Not on file  . Attends Archivist Meetings: Not on file  . Marital Status: Not on file   [name] Known Allergies Family History  Problem Relation Age of Onset  . Colon cancer Father   . Skin cancer Mother   . Rheum arthritis Mother   . Uterine cancer Maternal Grandmother   . Cancer Maternal Grandfather        ?  . Stroke Paternal Grandmother   . Hypertension Paternal Grandmother   . Esophageal  cancer Neg Hx   . Rectal cancer Neg Hx   . Stomach cancer Neg Hx           Current Outpatient Medications (Analgesics):  Marland Kitchen  Ibuprofen (ADVIL PO), Take by mouth as needed.     Current Outpatient Medications (Other):  Marland Kitchen  Vitamin D, Ergocalciferol, (DRISDOL) 1.25 MG (50000 UNIT) CAPS capsule, Take 1 capsule (50,000 Units total) by mouth every 7 (seven) days.  Current Facility-Administered Medications (Other):  .  0.9 %  sodium chloride infusion   Reviewed prior external information including notes and imaging from  primary care provider As well as notes that were available from care everywhere and other healthcare systems.  Past medical history, social, surgical and family history all reviewed in electronic medical record.  [name]  pertanent information unless stated regarding to the chief complaint.   Review of Systems:  [name] headache, visual changes, nausea, vomiting, diarrhea, constipation, dizziness, abdominal pain, skin rash, fevers, chills, night sweats, weight loss, swollen lymph nodes, body aches, joint swelling, chest pain, shortness of breath, mood changes. POSITIVE muscle aches  Objective  Blood pressure 110/80, pulse 64, height 6\' 1"  (1.854 m), weight 219 lb (99.3 kg), SpO2 98 %.   General: [name] apparent distress alert and oriented x3 mood and affect normal, dressed appropriately.  HEENT: Pupils equal, extraocular movements intact  Respiratory: Patient's speak in full sentences and does not appear short of breath  Cardiovascular: [name] lower extremity edema, non tender, [name] erythema  Skin: Warm dry intact with [name] signs of infection or rash on extremities or on axial skeleton.  Abdomen: Soft nontender  Neuro: Cranial nerves II through XII are intact, neurovascularly intact in all extremities with 2+ DTRs and 2+ pulses.  Lymph: [name] lymphadenopathy of posterior or anterior cervical chain or axillae bilaterally.  Gait normal with good balance and coordination.  MSK:  Non tender with full range of motion and good stability and symmetric strength and tone of shoulders, elbows, wrist, hip, and ankles bilaterally.  Right knee exam shows a mild lateral tracking of the patella noted.  Negative McMurray's.  Full range of motion.  More tenderness over the lateral joint line.  [name] instability though noted. Contralateral knee unremarkable.   Musculoskeletal ultrasound was performed and interpreted by Lyndal Pulley  Limited ultrasound of patient's right knee shows the patient does have unfortunately more of a chronic popliteal and subluxation noted.  Lateral meniscus appears to be unremarkable.  Mild arthritic changes of the patellofemoral joint laterally noted. Impression: Popliteal tendinitis   Impression and Recommendations:       This case required medical decision making of moderate complexity. The above documentation has been reviewed and is accurate and complete Lyndal Pulley, DO       Note: This dictation was prepared with Dragon dictation along with smaller phrase technology. Any transcriptional errors that result from this process are unintentional.

## 2019-08-02 NOTE — Patient Instructions (Signed)
Pop exercises 3x a week Tart Cherry 1200mg  at night Avoid extension Compression sleeve Once weekly vit d See me in 4-6 weeks

## 2019-08-02 NOTE — Assessment & Plan Note (Signed)
Discussed home exercises, icing regimen, discussed avoiding certain activities, follow-up again in 4 to 6 weeks compression sleeve we also discussed

## 2019-09-06 ENCOUNTER — Ambulatory Visit: Payer: 59 | Admitting: Family Medicine

## 2019-09-06 NOTE — Progress Notes (Deleted)
[facility] [place] Phone: [phone] Subjective:    I'm seeing this patient by the request  of:  Marton Redwood, MD  CC:   [id number]  Nathaniel [name] is a 44 y.o. male coming in with complaint of ***  Onset-  Location Duration-  Character- Aggravating factors- Reliving factors-  Therapies tried-  Severity-     Past Medical History:  Diagnosis Date  . Bilateral bunions   . Shin splints    Past Surgical History:  Procedure Laterality Date  . COLONOSCOPY    . WISDOM TOOTH EXTRACTION     Social History   Socioeconomic History  . Marital status: Single    Spouse name: Not on file  . Number of children: 1  . Years of education: Not on file  . Highest education level: Not on file  Occupational History    Employer: [employer]  Tobacco Use  . Smoking status: Never Smoker  . Smokeless tobacco: Never Used  Substance and Sexual Activity  . Alcohol use: Yes    Comment: occasionally  . Drug use: No  . Sexual activity: Not on file  Other Topics Concern  . Not on file  Social History Narrative  . Not on file   Social Determinants of Health   Financial Resource Strain:   . Difficulty of Paying Living Expenses: Not on file  Food Insecurity:   . Worried About Charity fundraiser in the Last Year: Not on file  . Ran Out of Food in the Last Year: Not on file  Transportation Needs:   . Lack of Transportation (Medical): Not on file  . Lack of Transportation (Non-Medical): Not on file  Physical Activity:   . Days of Exercise per Week: Not on file  . Minutes of Exercise per Session: Not on file  Stress:   . Feeling of Stress : Not on file  Social Connections:   . Frequency of Communication with Friends and Family: Not on file  . Frequency of Social Gatherings with Friends and Family: Not on file  . Attends Religious Services: Not on file  . Active Member of Clubs or Organizations: Not on file  .  Attends Archivist Meetings: Not on file  . Marital Status: Not on file   No Known Allergies Family History  Problem Relation Age of Onset  . Colon cancer Father   . Skin cancer Mother   . Rheum arthritis Mother   . Uterine cancer Maternal Grandmother   . Cancer Maternal Grandfather        ?  . Stroke Paternal Grandmother   . Hypertension Paternal Grandmother   . Esophageal cancer Neg Hx   . Rectal cancer Neg Hx   . Stomach cancer Neg Hx           Current Outpatient Medications (Analgesics):  Marland Kitchen  Ibuprofen (ADVIL PO), Take by mouth as needed.     Current Outpatient Medications (Other):  Marland Kitchen  Vitamin D, Ergocalciferol, (DRISDOL) 1.25 MG (50000 UNIT) CAPS capsule, Take 1 capsule (50,000 Units total) by mouth every 7 (seven) days.  Current Facility-Administered Medications (Other):  .  0.9 %  sodium chloride infusion   Reviewed prior external information including notes and imaging from  primary care provider As well as notes that were available from care everywhere and other healthcare systems.  Past medical history, social, surgical and family history all reviewed in electronic medical record.  No  pertanent information unless stated regarding to the chief complaint.   Review of Systems:  No headache, visual changes, nausea, vomiting, diarrhea, constipation, dizziness, abdominal pain, skin rash, fevers, chills, night sweats, weight loss, swollen lymph nodes, body aches, joint swelling, chest pain, shortness of breath, mood changes. POSITIVE muscle aches  Objective  There were no vitals taken for this visit.   General: No apparent distress alert and oriented x3 mood and affect normal, dressed appropriately.  HEENT: Pupils equal, extraocular movements intact  Respiratory: Patient's speak in full sentences and does not appear short of breath  Cardiovascular: No lower extremity edema, non tender, no erythema  Skin: Warm dry intact with no signs of infection or  rash on extremities or on axial skeleton.  Abdomen: Soft nontender  Neuro: Cranial nerves II through XII are intact, neurovascularly intact in all extremities with 2+ DTRs and 2+ pulses.  Lymph: No lymphadenopathy of posterior or anterior cervical chain or axillae bilaterally.  Gait normal with good balance and coordination.  MSK:  Non tender with full range of motion and good stability and symmetric strength and tone of shoulders, elbows, wrist, hip, knee and ankles bilaterally.     Impression and Recommendations:     This case required medical decision making of moderate complexity. The above documentation has been reviewed and is accurate and complete Lyndal Pulley, DO       Note: This dictation was prepared with Dragon dictation along with smaller phrase technology. Any transcriptional errors that result from this process are unintentional.

## 2019-09-19 ENCOUNTER — Encounter: Payer: Self-pay | Admitting: Family Medicine

## 2019-09-19 ENCOUNTER — Ambulatory Visit (INDEPENDENT_AMBULATORY_CARE_PROVIDER_SITE_OTHER): Payer: 59

## 2019-09-19 ENCOUNTER — Other Ambulatory Visit: Payer: Self-pay

## 2019-09-19 ENCOUNTER — Ambulatory Visit (INDEPENDENT_AMBULATORY_CARE_PROVIDER_SITE_OTHER): Payer: 59 | Admitting: Family Medicine

## 2019-09-19 VITALS — BP 130/70 | HR 68 | Ht 73.0 in | Wt 218.0 lb

## 2019-09-19 DIAGNOSIS — M25361 Other instability, right knee: Secondary | ICD-10-CM | POA: Diagnosis not present

## 2019-09-19 DIAGNOSIS — G8929 Other chronic pain: Secondary | ICD-10-CM

## 2019-09-19 DIAGNOSIS — M25561 Pain in right knee: Secondary | ICD-10-CM

## 2019-09-19 NOTE — Assessment & Plan Note (Signed)
Patient is now having locking of the right knee.  Concern at this time for the possibility of a large bucket-handle meniscal tear.  Patient does not have full flexion or full extension.  With a internal derangement of the knee x-rays ordered today to rule out any bony abnormality but patient will likely need MRI.  Patient may need surgical intervention depending on what is found.  Patient is very active and does find it difficult to even stay active secondary to pain.

## 2019-09-19 NOTE — Patient Instructions (Addendum)
Good to see you Knee xray today MRI ordered I will write you with the results  We will talk

## 2019-09-19 NOTE — Progress Notes (Signed)
[place] [facility] [address] [place] Phone: [phone] Subjective:   I Kandace Blitz am serving as a Education administrator for Dr. Hulan Saas.  This visit occurred during the SARS-CoV-2 public health emergency.  Safety protocols were in place, including screening questions prior to the visit, additional usage of staff PPE, and extensive cleaning of exam room while observing appropriate contact time as indicated for disinfecting solutions.   I'm seeing this patient by the request  of:  Marton Redwood, MD  CC: Right knee pain follow-up  [id number]   08/02/2019 Discussed home exercises, icing regimen, discussed avoiding certain activities, follow-up again in 4 to 6 weeks compression sleeve we also discussed  09/19/2019 Nathaniel [name] is a 44 y.o. male coming in with complaint of right knee pain. Patient states he hasn't gotten much better. Pulled his back last Monday. Has been stretching. Worked out on Saturday and noticed his hamstrings were tight. Has tried jogging on the knee and noticed he still had pain. Knee is so bad he can barely walk. Not doing as many boot camp workouts. Sitting for long periods of time is painful. Hips are doing well. Hasn't gotten tart cherry.  Patient states from time to time it seems to lock on him.  Even at night.  Laying down he is unable to straighten his leg out the other night.  Cannot walk without a limp at the moment     Past Medical History:  Diagnosis Date  . Bilateral bunions   . Shin splints    Past Surgical History:  Procedure Laterality Date  . COLONOSCOPY    . WISDOM TOOTH EXTRACTION     Social History   Socioeconomic History  . Marital status: Single    Spouse name: Not on file  . Number of children: 1  . Years of education: Not on file  . Highest education level: Not on file  Occupational History    Employer: [employer]  Tobacco Use  . Smoking status: Never Smoker  . Smokeless tobacco: Never  Used  Substance and Sexual Activity  . Alcohol use: Yes    Comment: occasionally  . Drug use: No  . Sexual activity: Not on file  Other Topics Concern  . Not on file  Social History Narrative  . Not on file   Social Determinants of Health   Financial Resource Strain:   . Difficulty of Paying Living Expenses:   Food Insecurity:   . Worried About Charity fundraiser in the Last Year:   . Arboriculturist in the Last Year:   Transportation Needs:   . Film/video editor (Medical):   Marland Kitchen Lack of Transportation (Non-Medical):   Physical Activity:   . Days of Exercise per Week:   . Minutes of Exercise per Session:   Stress:   . Feeling of Stress :   Social Connections:   . Frequency of Communication with Friends and Family:   . Frequency of Social Gatherings with Friends and Family:   . Attends Religious Services:   . Active Member of Clubs or Organizations:   . Attends Archivist Meetings:   Marland Kitchen Marital Status:    No Known Allergies Family History  Problem Relation Age of Onset  . Colon cancer Father   . Skin cancer Mother   . Rheum arthritis Mother   . Uterine cancer Maternal Grandmother   . Cancer Maternal Grandfather        ?  Marland Kitchen  Stroke Paternal Grandmother   . Hypertension Paternal Grandmother   . Esophageal cancer Neg Hx   . Rectal cancer Neg Hx   . Stomach cancer Neg Hx           Current Outpatient Medications (Analgesics):  Marland Kitchen  Ibuprofen (ADVIL PO), Take by mouth as needed.     Current Outpatient Medications (Other):  Marland Kitchen  Vitamin D, Ergocalciferol, (DRISDOL) 1.25 MG (50000 UNIT) CAPS capsule, Take 1 capsule (50,000 Units total) by mouth every 7 (seven) days.  Current Facility-Administered Medications (Other):  .  0.9 %  sodium chloride infusion   Reviewed prior external information including notes and imaging from  primary care provider As well as notes that were available from care everywhere and other healthcare systems.  Past medical  history, social, surgical and family history all reviewed in electronic medical record.  No pertanent information unless stated regarding to the chief complaint.   Review of Systems:  No headache, visual changes, nausea, vomiting, diarrhea, constipation, dizziness, abdominal pain, skin rash, fevers, chills, night sweats, weight loss, swollen lymph nodes, body aches, joint swelling, chest pain, shortness of breath, mood changes. POSITIVE muscle aches  Objective  Blood pressure 130/70, pulse 68, height 6\' 1"  (1.854 m), weight 218 lb (98.9 kg), SpO2 97 %.   General: No apparent distress alert and oriented x3 mood and affect normal, dressed appropriately.  HEENT: Pupils equal, extraocular movements intact  Respiratory: Patient's speak in full sentences and does not appear short of breath  Cardiovascular: No lower extremity edema, non tender, no erythema  Skin: Warm dry intact with no signs of infection or rash on extremities or on axial skeleton.  Abdomen: Soft nontender  Neuro: Cranial nerves II through XII are intact, neurovascularly intact in all extremities with 2+ DTRs and 2+ pulses.  Lymph: No lymphadenopathy of posterior or anterior cervical chain or axillae bilaterally.  Gait antalgic MSK: Right knee shows the patient does have a positive McMurray's noted.  Patient does not have the last 10 degrees of extension and is lacking the last 20 degrees of flexion.  Tender to palpation over the lateral joint line worse than the medial.  Neurovascular intact distally.    Impression and Recommendations:     This case required medical decision making of moderate complexity. The above documentation has been reviewed and is accurate and complete Lyndal Pulley, DO       Note: This dictation was prepared with Dragon dictation along with smaller phrase technology. Any transcriptional errors that result from this process are unintentional.

## 2019-09-26 ENCOUNTER — Telehealth: Payer: Self-pay | Admitting: Family Medicine

## 2019-09-26 NOTE — Telephone Encounter (Signed)
Patient called stating that when he was here for his last visit he thought that he remembered Dr Tamala Julian saying that he had a blood clot in his knee? He was concerned about this and did not know if he should do anything about it.  He is going to call [facility] today to follow up on scheduling the MRI.

## 2019-09-26 NOTE — Telephone Encounter (Signed)
Spoke with patient that per a verbal from Dr. Tamala Julian he is not concerned for a blood clot but would like patient to schedule MRI to figure out next steps. Patient voices understanding.

## 2019-10-15 ENCOUNTER — Other Ambulatory Visit: Payer: Self-pay

## 2019-10-15 ENCOUNTER — Ambulatory Visit
Admission: RE | Admit: 2019-10-15 | Discharge: 2019-10-15 | Disposition: A | Discharge status: Home / Self Care | Payer: 59 | Source: Ambulatory Visit | Attending: Family Medicine | Admitting: Family Medicine

## 2019-10-15 DIAGNOSIS — G8929 Other chronic pain: Secondary | ICD-10-CM

## 2019-10-15 DIAGNOSIS — M25561 Pain in right knee: Secondary | ICD-10-CM

## 2019-10-16 ENCOUNTER — Other Ambulatory Visit: Payer: Self-pay

## 2019-10-16 DIAGNOSIS — M25361 Other instability, right knee: Secondary | ICD-10-CM

## 2020-03-07 ENCOUNTER — Ambulatory Visit (INDEPENDENT_AMBULATORY_CARE_PROVIDER_SITE_OTHER): Payer: 59 | Admitting: Family Medicine

## 2020-03-07 ENCOUNTER — Ambulatory Visit (INDEPENDENT_AMBULATORY_CARE_PROVIDER_SITE_OTHER): Payer: 59

## 2020-03-07 ENCOUNTER — Encounter: Payer: Self-pay | Admitting: Family Medicine

## 2020-03-07 ENCOUNTER — Other Ambulatory Visit: Payer: Self-pay

## 2020-03-07 VITALS — BP 104/64 | HR 63 | Ht 73.0 in | Wt 217.8 lb

## 2020-03-07 DIAGNOSIS — M5431 Sciatica, right side: Secondary | ICD-10-CM

## 2020-03-07 MED ORDER — PREDNISONE 50 MG PO TABS: 50.0000 mg | ORAL_TABLET | Freq: Every day | ORAL | 0 refills | Status: DC

## 2020-03-07 MED ORDER — GABAPENTIN 300 MG PO CAPS: 300.0000 mg | ORAL_CAPSULE | Freq: Three times a day (TID) | ORAL | 3 refills | Status: DC | PRN

## 2020-03-07 NOTE — Patient Instructions (Addendum)
Thank you for coming in today. Get xray today. Plan for PT.  Take prednisone daily for 5 days and gabapentin as needed.   If not better let me know.   Let me know if worsening.   Try to give PT about 4-6 weeks.  Next step is MRI if needed.     Sciatica  Sciatica is pain, weakness, tingling, or loss of feeling (numbness) along the sciatic nerve. The sciatic nerve starts in the lower back and goes down the back of each leg. Sciatica usually goes away on its own or with treatment. Sometimes, sciatica may come back (recur). What are the causes? This condition happens when the sciatic nerve is pinched or has pressure put on it. This may be the result of:  A disk in between the bones of the spine bulging out too far (herniated disk).  Changes in the spinal disks that occur with aging.  A condition that affects a muscle in the butt.  Extra bone growth near the sciatic nerve.  A break (fracture) of the area between your hip bones (pelvis).  Pregnancy.  Tumor. This is rare. What increases the risk? You are more likely to develop this condition if you:  Play sports that put pressure or stress on the spine.  Have poor strength and ease of movement (flexibility).  Have had a back injury in the past.  Have had back surgery.  Sit for long periods of time.  Do activities that involve bending or lifting over and over again.  Are very overweight (obese). What are the signs or symptoms? Symptoms can vary from mild to very bad. They may include:  Any of these problems in the lower back, leg, hip, or butt: ? Mild tingling, loss of feeling, or dull aches. ? Burning sensations. ? Sharp pains.  Loss of feeling in the back of the calf or the sole of the foot.  Leg weakness.  Very bad back pain that makes it hard to move. These symptoms may get worse when you cough, sneeze, or laugh. They may also get worse when you sit or stand for long periods of time. How is this  treated? This condition often gets better without any treatment. However, treatment may include:  Changing or cutting back on physical activity when you have pain.  Doing exercises and stretching.  Putting ice or heat on the affected area.  Medicines that help: ? To relieve pain and swelling. ? To relax your muscles.  Shots (injections) of medicines that help to relieve pain, irritation, and swelling.  Surgery. Follow these instructions at home: Medicines  Take over-the-counter and prescription medicines only as told by your doctor.  Ask your doctor if the medicine prescribed to you: ? Requires you to avoid driving or using heavy machinery. ? Can cause trouble pooping (constipation). You may need to take these steps to prevent or treat trouble pooping:  Drink enough fluids to keep your pee (urine) pale yellow.  Take over-the-counter or prescription medicines.  Eat foods that are high in fiber. These include beans, whole grains, and fresh fruits and vegetables.  Limit foods that are high in fat and sugar. These include fried or sweet foods. Managing pain      If told, put ice on the affected area. ? Put ice in a plastic bag. ? Place a towel between your skin and the bag. ? Leave the ice on for 20 minutes, 2-3 times a day.  If told, put heat on the affected  area. Use the heat source that your doctor tells you to use, such as a moist heat pack or a heating pad. ? Place a towel between your skin and the heat source. ? Leave the heat on for 20-30 minutes. ? Remove the heat if your skin turns bright red. This is very important if you are unable to feel pain, heat, or cold. You may have a greater risk of getting burned. Activity   Return to your normal activities as told by your doctor. Ask your doctor what activities are safe for you.  Avoid activities that make your symptoms worse.  Take short rests during the day. ? When you rest for a long time, do some physical  activity or stretching between periods of rest. ? Avoid sitting for a long time without moving. Get up and move around at least one time each hour.  Exercise and stretch regularly, as told by your doctor.  Do not lift anything that is heavier than 10 lb (4.5 kg) while you have symptoms of sciatica. ? Avoid lifting heavy things even when you do not have symptoms. ? Avoid lifting heavy things over and over.  When you lift objects, always lift in a way that is safe for your body. To do this, you should: ? Bend your knees. ? Keep the object close to your body. ? Avoid twisting. General instructions  Stay at a healthy weight.  Wear comfortable shoes that support your feet. Avoid wearing high heels.  Avoid sleeping on a mattress that is too soft or too hard. You might have less pain if you sleep on a mattress that is firm enough to support your back.  Keep all follow-up visits as told by your doctor. This is important. Contact a doctor if:  You have pain that: ? Wakes you up when you are sleeping. ? Gets worse when you lie down. ? Is worse than the pain you have had in the past. ? Lasts longer than 4 weeks.  You lose weight without trying. Get help right away if:  You cannot control when you pee (urinate) or poop (have a bowel movement).  You have weakness in any of these areas and it gets worse: ? Lower back. ? The area between your hip bones. ? Butt. ? Legs.  You have redness or swelling of your back.  You have a burning feeling when you pee. Summary  Sciatica is pain, weakness, tingling, or loss of feeling (numbness) along the sciatic nerve.  This condition happens when the sciatic nerve is pinched or has pressure put on it.  Sciatica can cause pain, tingling, or loss of feeling (numbness) in the lower back, legs, hips, and butt.  Treatment often includes rest, exercise, medicines, and putting ice or heat on the affected area. This information is not intended to  replace advice given to you by your health care provider. Make sure you discuss any questions you have with your health care provider. Document Revised: 07/10/2018 Document Reviewed: 07/10/2018 Elsevier Patient Education  [employer].

## 2020-03-07 NOTE — Progress Notes (Signed)
   I, Wendy Poet, LAT, ATC, am serving as scribe for Dr. Lynne Leader.  Nathaniel [name] is a 44 y.o. male who presents to [place] at [facility] today for sciatica / R leg pain.  He was last seen by Dr. Tamala Julian on 09/19/19 for his R knee.  Since then, pt reports that he is having R leg pain x approximately 2 weeks.  He reports pain in his R glute that runs into his R post thigh that he describes as a burning-type pain.  He denies any numbness/tingling into R LE.  He is a very active person and likes to ride his bike and do bootcamps.  R LE numbness/tingling: No R LE weakness: No Low back pian: No Aggravating factors: transitioning from sit-to-stand; trying to fully extend his R LE; lumbar flexion; active R LE movement, particularly R hip flexion Treatments tried: Advil;    Pertinent review of systems: No fevers or chills  Relevant historical information: Healthy athletic person.  History of meniscus tear right knee.   Exam:  BP 104/64 (BP Location: Right Arm, Patient Position: Sitting, Cuff Size: Large)   Pulse 63   Ht 6\' 1"  (1.854 m)   Wt 217 lb 12.8 oz (98.8 kg)   SpO2 96%   BMI 28.74 kg/m  General: Well Developed, well nourished, and in no acute distress.   MSK: L-spine normal-appearing Nontender midline. Normal lumbar motion. Positive right-sided slump test and straight leg raise test. Lower extremity strength is intact. Reflexes and sensation equal intact bilateral lower extremities.  Right hip normal-appearing nontender normal motion.  Not able to reproduce symptoms pressure over piriformis.  Lab and Radiology Results  X-ray lumbar spine ordered today images not available at the time of this note.    Assessment and Plan: 44 y.o. male with right posterior thigh burning pain consistent with sciatica.  Discussed options.  Plan for physical therapy, prednisone and gabapentin.  If not improving will proceed with MRI lumbar spine.  Await radiology read x-ray  lumbar spine obtained today.    Orders Placed This Encounter  Procedures  . DG Lumbar Spine 2-3 Views    Standing Status:   Future    Number of Occurrences:   1    Standing Expiration Date:   03/07/2021    Order Specific Question:   Reason for Exam (SYMPTOM  OR DIAGNOSIS REQUIRED)    Answer:   eval right sciatica    Order Specific Question:   Preferred imaging location?    Answer:   [facility]    Order Specific Question:   Radiology Contrast Protocol - do NOT remove file path    Answer:   [email or web address]  . Ambulatory referral to Physical Therapy    Referral Priority:   Routine    Referral Type:   Physical Medicine    Referral Reason:   Specialty Services Required    Requested Specialty:   Physical Therapy    Number of Visits Requested:   1   No orders of the defined types were placed in this encounter.    Discussed warning signs or symptoms. Please see discharge instructions. Patient expresses understanding.   The above documentation has been reviewed and is accurate and complete Lynne Leader, M.D.

## 2020-03-11 NOTE — Progress Notes (Signed)
Mild arthritis present and lumbar spine.

## 2020-06-30 ENCOUNTER — Other Ambulatory Visit: Payer: 59

## 2020-06-30 DIAGNOSIS — Z20822 Contact with and (suspected) exposure to covid-19: Secondary | ICD-10-CM

## 2020-07-01 LAB — NOVEL CORONAVIRUS, NAA: SARS-CoV-2, NAA: NOT DETECTED

## 2020-07-01 LAB — SARS-COV-2, NAA 2 DAY TAT

## 2020-07-02 ENCOUNTER — Telehealth: Payer: Self-pay | Admitting: Internal Medicine

## 2020-07-02 NOTE — Telephone Encounter (Signed)
Negative COVID results given. Patient results "NOT Detected." Caller expressed understanding. ° °

## 2021-07-12 ENCOUNTER — Encounter: Payer: Self-pay | Admitting: Gastroenterology

## 2021-10-16 IMAGING — MR MR KNEE*R* W/O CM
4 of 6 series · 19 of 40 positions shown · non-contrast
Comparison: None.

CLINICAL DATA: Chronic knee pain.

EXAM:
MRI OF THE RIGHT KNEE WITHOUT CONTRAST
TECHNIQUE: Multiplanar, multisequence MR imaging of the knee was performed. No
intravenous contrast was administered.

[Series 3: T2 fat-sat · axial · 4.0mm · 0.31mm/px · z∈[-37,+51]mm · 3 of 31 slices shown (1 of 2)]
[im 6/31]
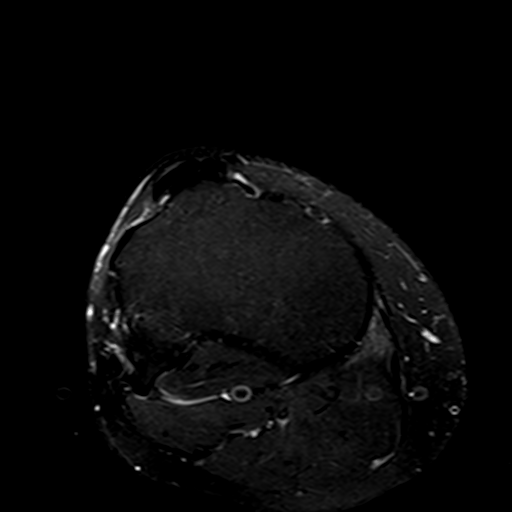
[im 16/31]
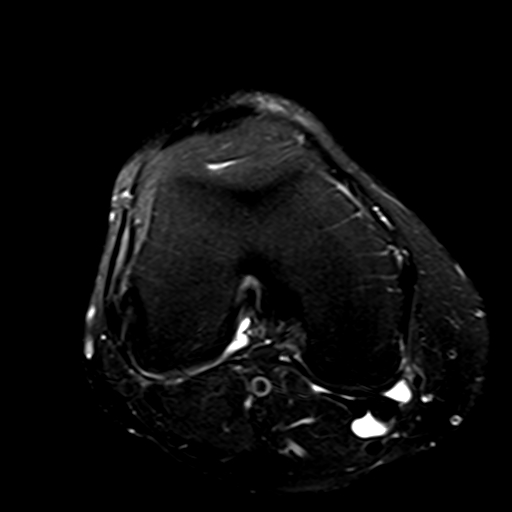
[im 26/31]
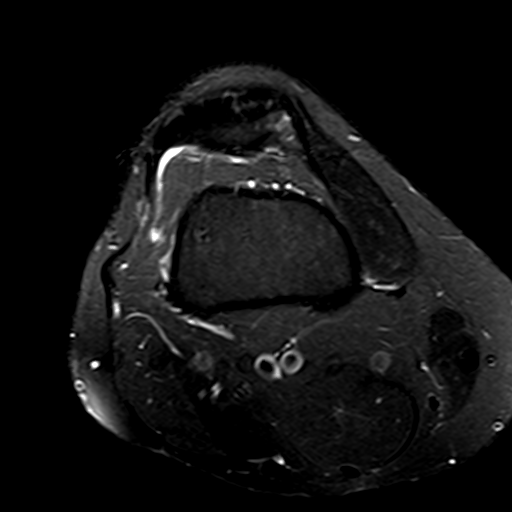

[Series 5: T2 fat-sat · coronal · 4.0mm · 0.29mm/px · 3 of 24 slices shown (2 of 2)]
[im 5/24]
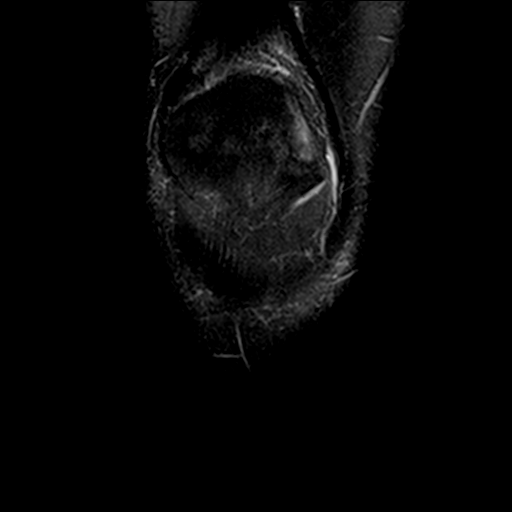
[im 14/24]
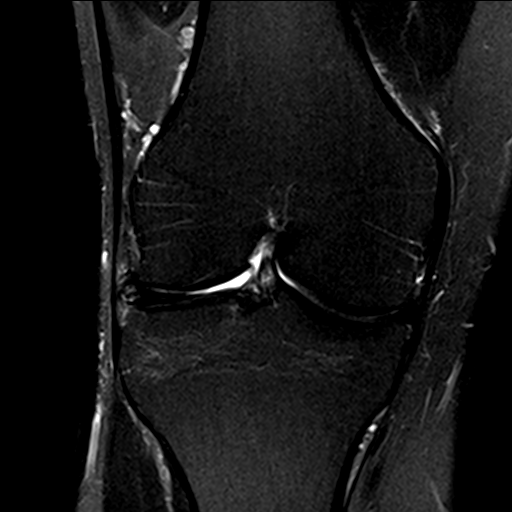
[im 24/24]
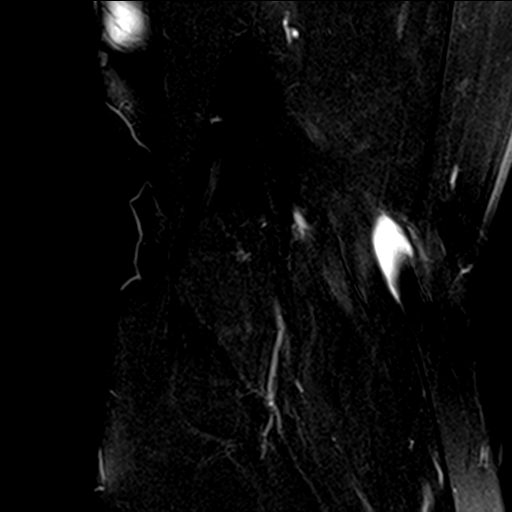

[Series 7: PD fat-sat · sagittal · 3.0mm · 0.29mm/px · 7 of 30 slices shown (1 of 2)]
[im 1/30]
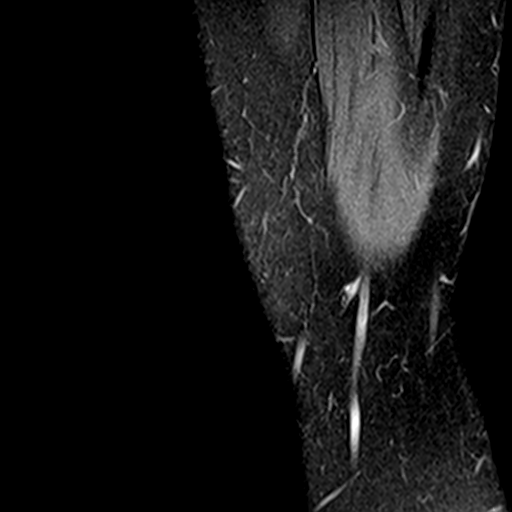
[im 5/30]
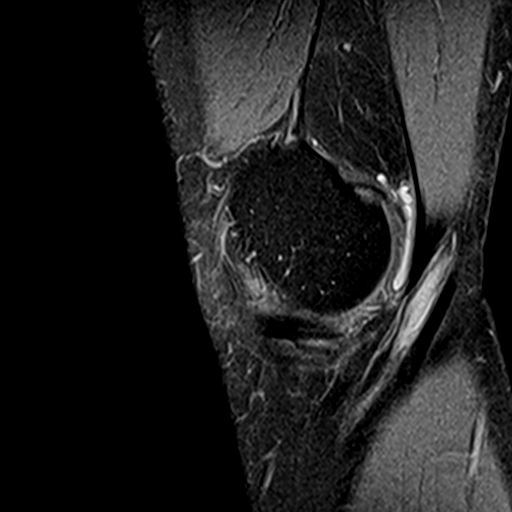
[im 10/30]
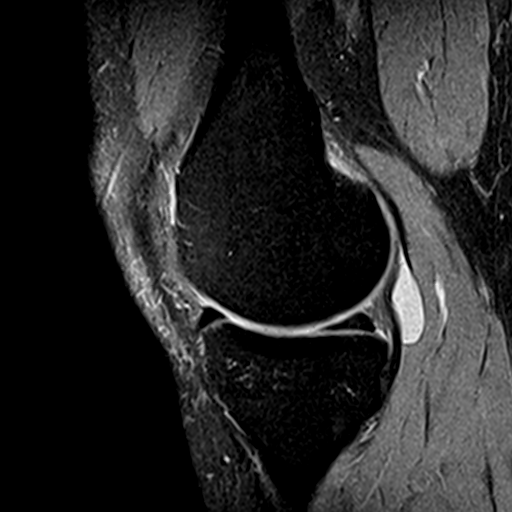
[im 15/30]
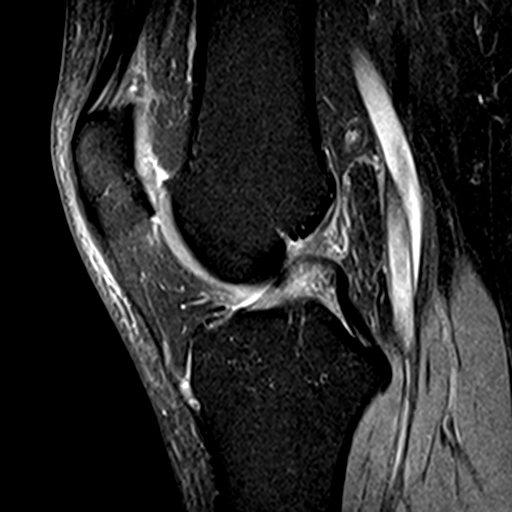
[im 20/30]
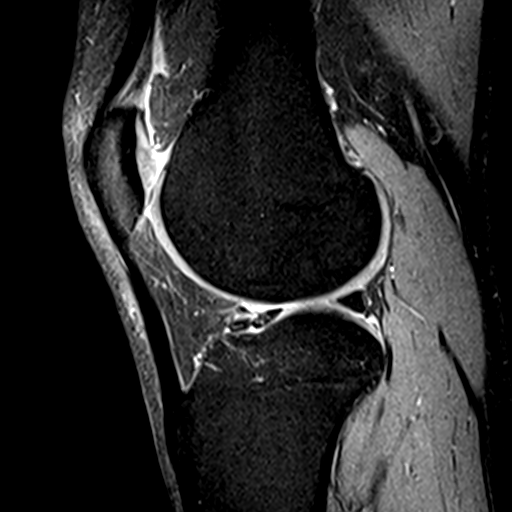
[im 25/30]
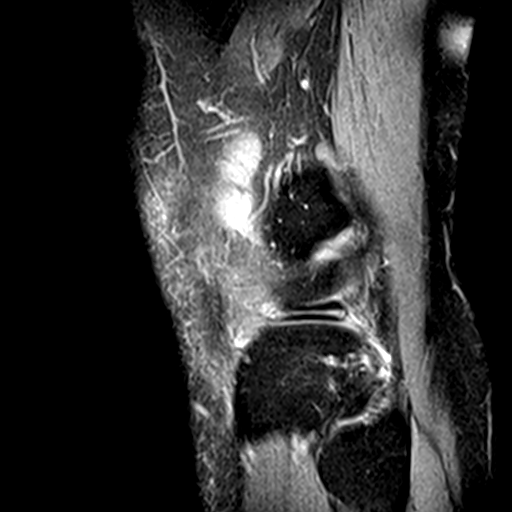
[im 30/30]
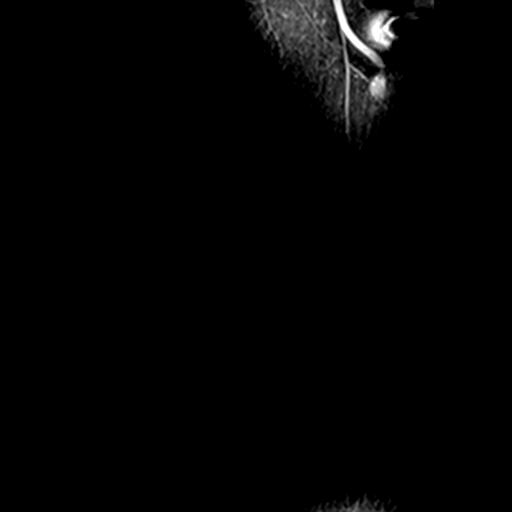

[Series 9: PD fat-sat · coronal · 3.0mm · 0.29mm/px · 6 of 28 slices shown (2 of 2)]
[im 1/28]
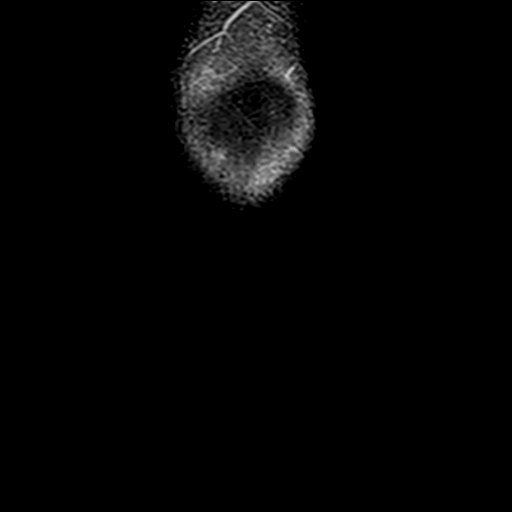
[im 5/28]
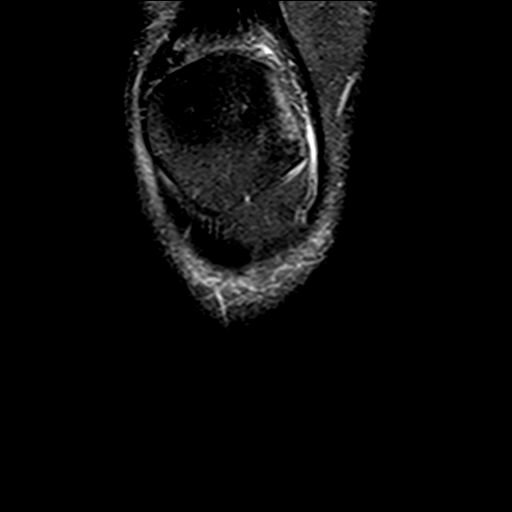
[im 10/28]
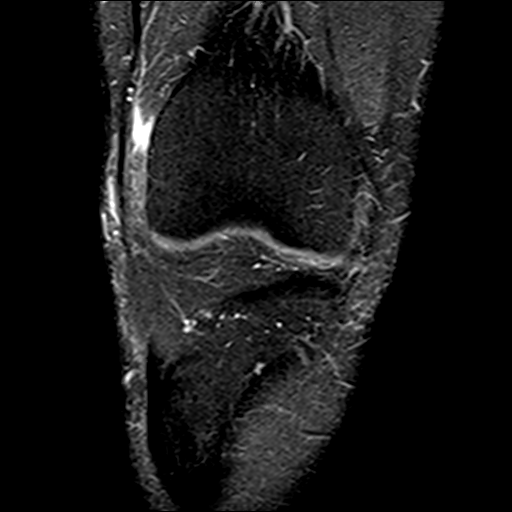
[im 14/28]
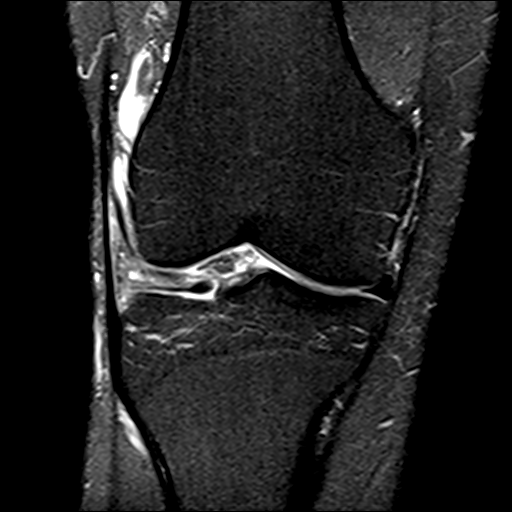
[im 19/28]
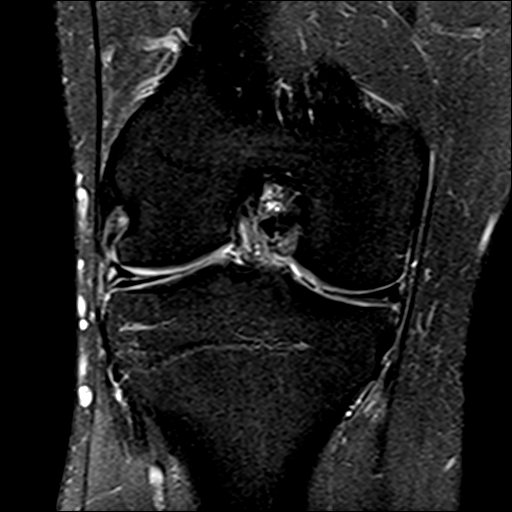
[im 23/28]
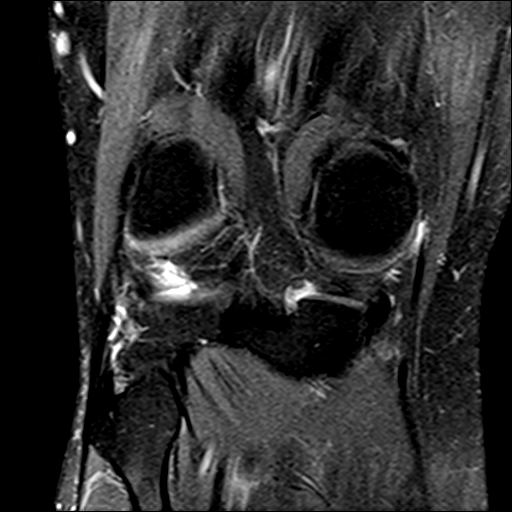

[19 of 40 positions shown; findings below may reference images not displayed]

FINDINGS: MENISCI

Medial meniscus: Oblique coursing inferior articular surface tear
involving the posterior horn near the midbody junction region.

Lateral meniscus: Fairly extensive horizontal cleavage-type tear
involving the posterior horn midbody region and anterior horn. There
appears to be a small loose body anterior to the anterior horn which
could be a small flipped meniscal fragment.

LIGAMENTS

Cruciates:  Intact

Collaterals:  Intact

CARTILAGE

Patellofemoral: Sizable chondral flap type tear at the patellar
apex.

Medial:  Mild degenerative chondrosis.

Lateral:  Mild degenerative chondrosis.

Joint:  Small joint effusion.

Popliteal Fossa:  Small to moderate-sized Baker's cyst.

Extensor Mechanism: The patella retinacular structures are intact
and the quadriceps and patellar tendons are intact.

Bones:  No acute bony findings.

Other: Normal knee musculature.
IMPRESSION: 1. Medial and lateral meniscal tears as described above.
2. Intact ligamentous structures and no acute bony findings.
3. Sizable chondral flap type tear at the patellar apex.
4. Small joint effusion and small to moderate-sized Baker's cyst.

## 2021-12-04 DIAGNOSIS — M79671 Pain in right foot: Secondary | ICD-10-CM | POA: Insufficient documentation

## 2021-12-04 DIAGNOSIS — S93601A Unspecified sprain of right foot, initial encounter: Secondary | ICD-10-CM | POA: Insufficient documentation

## 2022-04-21 ENCOUNTER — Encounter: Payer: Self-pay | Admitting: Gastroenterology

## 2022-04-29 ENCOUNTER — Ambulatory Visit (AMBULATORY_SURGERY_CENTER): Payer: Self-pay | Admitting: *Deleted

## 2022-04-29 VITALS — Ht 73.0 in | Wt 218.0 lb

## 2022-04-29 DIAGNOSIS — Z800 Family history of malignant neoplasm of digestive organs: Secondary | ICD-10-CM

## 2022-04-29 MED ORDER — NA SULFATE-K SULFATE-MG SULF 17.5-3.13-1.6 GM/177ML PO SOLN: 1.0000 | Freq: Once | ORAL | 0 refills | Status: AC

## 2022-04-29 NOTE — Progress Notes (Signed)
No egg or soy allergy known to patient  No issues known to pt with past sedation with any surgeries or procedures Patient denies ever being told they had issues or difficulty with intubation  No FH of Malignant Hyperthermia Pt is not on diet pills Pt is not on home 02  Pt is not on blood thinners  Pt denies issues with constipation  No A fib or A flutter Have any cardiac testing pending--NO Pt instructed to use Singlecare.com or GoodRx for a price reduction on prep   

## 2022-05-31 ENCOUNTER — Encounter: Payer: 59 | Admitting: Gastroenterology

## 2022-06-11 ENCOUNTER — Other Ambulatory Visit: Payer: Self-pay

## 2022-06-14 ENCOUNTER — Encounter: Payer: Self-pay | Admitting: Gastroenterology

## 2022-06-17 ENCOUNTER — Ambulatory Visit (AMBULATORY_SURGERY_CENTER): Payer: 59 | Admitting: Gastroenterology

## 2022-06-17 ENCOUNTER — Encounter: Payer: Self-pay | Admitting: Gastroenterology

## 2022-06-17 VITALS — BP 116/52 | HR 61 | Temp 98.6°F | Resp 16 | Ht 73.0 in | Wt 218.0 lb

## 2022-06-17 DIAGNOSIS — Z1211 Encounter for screening for malignant neoplasm of colon: Secondary | ICD-10-CM

## 2022-06-17 DIAGNOSIS — Z800 Family history of malignant neoplasm of digestive organs: Secondary | ICD-10-CM

## 2022-06-17 MED ORDER — SODIUM CHLORIDE 0.9 % IV SOLN: 500.0000 mL | INTRAVENOUS | Status: DC

## 2022-06-17 NOTE — Op Note (Signed)
[facility] Patient Name: Nathaniel [name] Procedure Date: 06/17/2022 4:01 PM MRN: [id number] Endoscopist: Thornton Park MD, MD, [id number] Age: 46 Referring MD:  Date of Birth: August 02, 1975 Gender: Male Account #: [id number] Procedure:                Colonoscopy Indications:              Screening for colorectal malignant neoplasm                           Father with colon cancer in his early 74s                           Normal colonoscopies with Dr. Ardis Hughs in 2012 and                            2018 Medicines:                Monitored Anesthesia Care Procedure:                Pre-Anesthesia Assessment:                           - Prior to the procedure, a History and Physical                            was performed, and patient medications and                            allergies were reviewed. The patient's tolerance of                            previous anesthesia was also reviewed. The risks                            and benefits of the procedure and the sedation                            options and risks were discussed with the patient.                            All questions were answered, and informed consent                            was obtained. Prior Anticoagulants: The patient has                            taken no anticoagulant or antiplatelet agents. ASA                            Grade Assessment: II - A patient with mild systemic                            disease. After reviewing the risks and benefits,  the patient was deemed in satisfactory condition to                            undergo the procedure.                           After obtaining informed consent, the colonoscope                            was passed under direct vision. Throughout the                            procedure, the patient's blood pressure, pulse, and                            oxygen saturations were monitored continuously. The                             CF [id number] #[id number] was introduced through the anus                            and advanced to the 3 cm into the ileum. A second                            forward view of the right colon was performed. The                            colonoscopy was performed without difficulty. The                            patient tolerated the procedure well. The quality                            of the bowel preparation was excellent. The                            terminal ileum, ileocecal valve, appendiceal                            orifice, and rectum were photographed. Scope In: 4:10:07 PM Scope Out: 4:21:40 PM Scope Withdrawal Time: 0 hours 9 minutes 38 seconds  Total Procedure Duration: 0 hours 11 minutes 33 seconds  Findings:                 The perianal and digital rectal examinations were                            normal.                           The entire examined colon appeared normal on direct                            and retroflexion views. Complications:  No immediate complications. Estimated Blood Loss:     Estimated blood loss: none. Impression:               - The entire examined colon is normal on direct and                            retroflexion views.                           - No specimens collected. Recommendation:           - Patient has a contact number available for                            emergencies. The signs and symptoms of potential                            delayed complications were discussed with the                            patient. Return to normal activities tomorrow.                            Written discharge instructions were provided to the                            patient.                           - Resume previous diet.                           - Continue present medications.                           - Repeat colonoscopy in 5 years for surveillance,                            earlier with new symptoms.                            - Emerging evidence supports eating a diet of                            fruits, vegetables, grains, calcium, and yogurt                            while reducing red meat and alcohol may reduce the                            risk of colon cancer.                           - Thank you for allowing me to be involved in your  colon cancer prevention. Thornton Park MD, MD 06/17/2022 4:33:43 PM This report has been signed electronically.

## 2022-06-17 NOTE — Patient Instructions (Signed)
- Patient has a contact number available for emergencies. The signs and symptoms of potential delayed complications were discussed with the patient. Return to normal activities tomorrow. Written discharge instructions were provided to the patient. - Resume previous diet. - Continue present medications. - Repeat colonoscopy in 5 years for surveillance, earlier with new symptoms. - Emerging evidence supports eating a diet of fruits, vegetables, grains, calcium, and yogurt while reducing red meat and alcohol may reduce the risk of colon cancer. - Thank you for allowing me to be involved in your colon cancer prevention.  YOU HAD AN ENDOSCOPIC PROCEDURE TODAY AT [facility]:   Refer to the procedure report that was given to you for any specific questions about what was found during the examination.  If the procedure report does not answer your questions, please call your gastroenterologist to clarify.  If you requested that your care partner not be given the details of your procedure findings, then the procedure report has been included in a sealed envelope for you to review at your convenience later.  YOU SHOULD EXPECT: Some feelings of bloating in the abdomen. Passage of more gas than usual.  Walking can help get rid of the air that was put into your GI tract during the procedure and reduce the bloating. If you had a lower endoscopy (such as a colonoscopy or flexible sigmoidoscopy) you may notice spotting of blood in your stool or on the toilet paper. If you underwent a bowel prep for your procedure, you may not have a normal bowel movement for a few days.  Please Note:  You might notice some irritation and congestion in your nose or some drainage.  This is from the oxygen used during your procedure.  There is no need for concern and it should clear up in a day or so.  SYMPTOMS TO REPORT IMMEDIATELY:  Following lower endoscopy (colonoscopy or flexible sigmoidoscopy):  Excessive amounts  of blood in the stool  Significant tenderness or worsening of abdominal pains  Swelling of the abdomen that is new, acute  Fever of 100F or higher  For urgent or emergent issues, a gastroenterologist can be reached at any hour by calling [phone]. Do not use MyChart messaging for urgent concerns.    DIET:  We do recommend a small meal at first, but then you may proceed to your regular diet.  Drink plenty of fluids but you should avoid alcoholic beverages for 24 hours.  ACTIVITY:  You should plan to take it easy for the rest of today and you should NOT DRIVE or use heavy machinery until tomorrow (because of the sedation medicines used during the test).    FOLLOW UP: Our staff will call the number listed on your records the next business day following your procedure.  We will call around 7:15- 8:00 am to check on you and address any questions or concerns that you may have regarding the information given to you following your procedure. If we do not reach you, we will leave a message.     If any biopsies were taken you will be contacted by phone or by letter within the next 1-3 weeks.  Please call us at [phone] if you have not heard about the biopsies in 3 weeks.    SIGNATURES/CONFIDENTIALITY: You and/or your care partner have signed paperwork which will be entered into your electronic medical record.  These signatures attest to the fact that that the information above on your After Visit Summary  has been reviewed and is understood.  Full responsibility of the confidentiality of this discharge information lies with you and/or your care-partner.

## 2022-06-17 NOTE — Progress Notes (Signed)
   Referring Provider: Ginger Organ., MD Primary Care Physician:  Ginger Organ., MD  Indication for Procedure:  Colon cancer Surveillance   IMPRESSION:  Need for colon cancer surveillance Appropriate candidate for monitored anesthesia care  PLAN: Colonoscopy in the [place] today   HPI: Nathaniel [name] is a 46 y.o. male presents for surveillance colonoscopy.  Prior endoscopic history: - Normal colonoscopy with Dr. Ardis Hughs in 2012 - Normal colonoscopy with Dr. Ardis Hughs in 2018  No baseline GI symptoms.   No known family history of colon cancer or polyps. No family history of uterine/endometrial cancer, pancreatic cancer or gastric/stomach cancer.   Past Medical History:  Diagnosis Date   Bilateral bunions    Cancer [facility])    SKIN   Shin splints     Past Surgical History:  Procedure Laterality Date   COLONOSCOPY     WISDOM TOOTH EXTRACTION      Current Outpatient Medications  Medication Sig Dispense Refill   Ascorbic Acid (VITAMIN C GUMMIE PO) Take by mouth. TAKE ONE 3 X A WEEK     FIBER ADULT GUMMIES PO Take by mouth. TAKE 3 X WEEK     Ibuprofen (ADVIL PO) Take by mouth as needed.     Multiple Vitamins-Minerals (ZINC PO) Take by mouth. TAKE ONE 3 X WEEK-GUMMIE     Vitamin D, Ergocalciferol, (DRISDOL) 1.25 MG (50000 UNIT) CAPS capsule Take 1 capsule (50,000 Units total) by mouth every 7 (seven) days. (Patient not taking: Reported on 03/07/2020) 12 capsule 0   Current Facility-Administered Medications  Medication Dose Route Frequency Provider Last Rate Last Admin   0.9 %  sodium chloride infusion  500 mL Intravenous Continuous Milus Banister, MD       0.9 %  sodium chloride infusion  500 mL Intravenous Continuous Thornton Park, MD        Allergies as of 06/17/2022   (No Known Allergies)    Family History  Problem Relation Age of Onset   Skin cancer Mother    Rheum arthritis Mother    Colon cancer Father    Uterine cancer Maternal Grandmother    Cancer  Maternal Grandfather        ?   Stroke Paternal Grandmother    Hypertension Paternal Grandmother    Esophageal cancer Neg Hx    Rectal cancer Neg Hx    Stomach cancer Neg Hx    Colon polyps Neg Hx    Crohn's disease Neg Hx    Ulcerative colitis Neg Hx      Physical Exam: General:   Alert,  well-nourished, pleasant and cooperative in NAD Head:  Normocephalic and atraumatic. Eyes:  Sclera clear, no icterus.   Conjunctiva pink. Mouth:  No deformity or lesions.   Neck:  Supple; no masses or thyromegaly. Lungs:  Clear throughout to auscultation.   No wheezes. Heart:  Regular rate and rhythm; no murmurs. Abdomen:  Soft, non-tender, nondistended, normal bowel sounds, no rebound or guarding.  Msk:  Symmetrical. No boney deformities LAD: No inguinal or umbilical LAD Extremities:  No clubbing or edema. Neurologic:  Alert and  oriented x4;  grossly nonfocal Skin:  No obvious rash or bruise. Psych:  Alert and cooperative. Normal mood and affect.     Studies/Results: No results found.    Ardine Iacovelli L. Tarri Glenn, MD, MPH 06/17/2022, 4:02 PM

## 2022-06-17 NOTE — Progress Notes (Signed)
Report to PACU, RN, vss, BBS= Clear.  

## 2022-06-18 ENCOUNTER — Telehealth: Payer: Self-pay

## 2022-06-18 NOTE — Telephone Encounter (Signed)
  Follow up Call-     06/17/2022    3:19 PM  Call back number  Post procedure Call Back phone  # [phone]  Permission to leave phone message Yes     Patient questions:  Do you have a fever, pain , or abdominal swelling? No. Pain Score  0 *  Have you tolerated food without any problems? Yes.    Have you been able to return to your normal activities? Yes.    Do you have any questions about your discharge instructions: Diet   No. Medications  No. Follow up visit  No.  Do you have questions or concerns about your Care? No.  Actions: * If pain score is 4 or above: No action needed, pain <4.

## 2024-03-12 DIAGNOSIS — M25512 Pain in left shoulder: Secondary | ICD-10-CM | POA: Insufficient documentation

## 2024-07-23 ENCOUNTER — Other Ambulatory Visit: Payer: Self-pay

## 2024-07-23 ENCOUNTER — Ambulatory Visit: Admitting: Family Medicine

## 2024-07-23 VITALS — BP 118/78 | HR 50 | Ht 73.0 in | Wt 195.0 lb

## 2024-07-23 DIAGNOSIS — M25512 Pain in left shoulder: Secondary | ICD-10-CM

## 2024-07-23 NOTE — Patient Instructions (Addendum)
 Thank you for coming in today.   Continue physical therapy  Let me know in a month if not better, and I will order a MRI
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
